# Patient Record
Sex: Female | Born: 1975 | Race: Black or African American | Hispanic: No | Marital: Married | Smoking: Never smoker
Health system: Southern US, Community
[De-identification: ages and names within clinical notes are randomized; demographics above are authoritative.]

## PROBLEM LIST (undated history)

## (undated) DIAGNOSIS — Z789 Other specified health status: Secondary | ICD-10-CM

## (undated) DIAGNOSIS — I10 Essential (primary) hypertension: Secondary | ICD-10-CM

## (undated) HISTORY — PX: WISDOM TOOTH EXTRACTION: SHX21

## (undated) HISTORY — DX: Other specified health status: Z78.9

## (undated) HISTORY — DX: Essential (primary) hypertension: I10

---

## 2017-06-17 DIAGNOSIS — Z3400 Encounter for supervision of normal first pregnancy, unspecified trimester: Secondary | ICD-10-CM | POA: Diagnosis not present

## 2017-06-17 DIAGNOSIS — Z3401 Encounter for supervision of normal first pregnancy, first trimester: Secondary | ICD-10-CM | POA: Diagnosis not present

## 2017-06-17 LAB — OB RESULTS CONSOLE ANTIBODY SCREEN: Antibody Screen: NEGATIVE

## 2017-06-17 LAB — OB RESULTS CONSOLE RUBELLA ANTIBODY, IGM: Rubella: IMMUNE

## 2017-06-17 LAB — OB RESULTS CONSOLE RPR: RPR: NONREACTIVE

## 2017-06-17 LAB — OB RESULTS CONSOLE ABO/RH: RH TYPE: POSITIVE

## 2017-06-17 LAB — OB RESULTS CONSOLE GC/CHLAMYDIA
Chlamydia: NEGATIVE
Gonorrhea: NEGATIVE

## 2017-06-17 LAB — OB RESULTS CONSOLE HIV ANTIBODY (ROUTINE TESTING): HIV: NONREACTIVE

## 2017-06-17 LAB — OB RESULTS CONSOLE HEPATITIS B SURFACE ANTIGEN: Hepatitis B Surface Ag: NEGATIVE

## 2017-07-03 ENCOUNTER — Other Ambulatory Visit: Payer: Self-pay | Admitting: Obstetrics and Gynecology

## 2017-07-03 ENCOUNTER — Other Ambulatory Visit (HOSPITAL_COMMUNITY)
Admission: RE | Admit: 2017-07-03 | Discharge: 2017-07-03 | Disposition: A | Discharge status: Home / Self Care | Payer: Federal, State, Local not specified - PPO | Source: Ambulatory Visit | Attending: Obstetrics and Gynecology | Admitting: Obstetrics and Gynecology

## 2017-07-03 DIAGNOSIS — O09511 Supervision of elderly primigravida, first trimester: Secondary | ICD-10-CM | POA: Diagnosis not present

## 2017-07-03 DIAGNOSIS — Z124 Encounter for screening for malignant neoplasm of cervix: Secondary | ICD-10-CM | POA: Diagnosis not present

## 2017-07-04 LAB — CYTOLOGY - PAP
Diagnosis: NEGATIVE
HPV (WINDOPATH): NOT DETECTED

## 2017-07-15 DIAGNOSIS — O09512 Supervision of elderly primigravida, second trimester: Secondary | ICD-10-CM | POA: Diagnosis not present

## 2017-07-15 DIAGNOSIS — O161 Unspecified maternal hypertension, first trimester: Secondary | ICD-10-CM | POA: Diagnosis not present

## 2017-08-19 DIAGNOSIS — Z3402 Encounter for supervision of normal first pregnancy, second trimester: Secondary | ICD-10-CM | POA: Diagnosis not present

## 2017-08-19 DIAGNOSIS — Z36 Encounter for antenatal screening for chromosomal anomalies: Secondary | ICD-10-CM | POA: Diagnosis not present

## 2017-08-19 DIAGNOSIS — O09512 Supervision of elderly primigravida, second trimester: Secondary | ICD-10-CM | POA: Diagnosis not present

## 2017-09-09 DIAGNOSIS — Z23 Encounter for immunization: Secondary | ICD-10-CM | POA: Diagnosis not present

## 2017-10-28 DIAGNOSIS — O09512 Supervision of elderly primigravida, second trimester: Secondary | ICD-10-CM | POA: Diagnosis not present

## 2017-10-28 DIAGNOSIS — O10012 Pre-existing essential hypertension complicating pregnancy, second trimester: Secondary | ICD-10-CM | POA: Diagnosis not present

## 2017-10-30 DIAGNOSIS — R7302 Impaired glucose tolerance (oral): Secondary | ICD-10-CM | POA: Diagnosis not present

## 2017-11-14 DIAGNOSIS — Z23 Encounter for immunization: Secondary | ICD-10-CM | POA: Diagnosis not present

## 2017-11-25 DIAGNOSIS — O09513 Supervision of elderly primigravida, third trimester: Secondary | ICD-10-CM | POA: Diagnosis not present

## 2017-11-26 NOTE — L&D Delivery Note (Signed)
Operative Delivery Note At 8:19 PM a viable female was delivered via Vaginal, Vacuum Investment banker, operational(Extractor).  Presentation: vertex; Position: Occiput,, Anterior; Low outlet  Pt had variable decelerations.  Pt attempted to push on hands and knees and prolonged variable decel with slow return near baseline was noted.  Recommended vacuum extraction.Verbal consent: obtained from patient.  Risks and benefits discussed in detail.  Risks include, but are not limited to the risks of anesthesia, bleeding, infection, damage to maternal tissues, fetal cephalhematoma.  There is also the risk of inability to effect vaginal delivery of the head, or shoulder dystocia that cannot be resolved by established maneuvers, leading to the need for emergency cesarean section.  With 3 pulls, head was brought to crowning and mother delivered.  APGAR: 8, 9; weight pending .   Placenta status: manually extracted-cord avulsion due to velamentous insertion. Nitroglycerin, Neosynephrine administered.  Intact.  Bedside ultrasound confirmed a thin EMS. Unasyn  IV given.Placenta delivered ~45 minutes after delivery of baby.  Pitocin was held during extraction.  It was resumed after extraction.  Bleeding was not excessive.  After the repair, bladder appeared full b/c pt was leaking.  Consent I/O cath obtained.  Prepped with Betadine.  Cord:  with the following complications Velamentous  insertion, Nuchal cord x 1: .  Cord pH: n/a  Anesthesia:  Stadol Instruments: Kiwi Episiotomy: None Lacerations: 3rd degree;Perineal Suture Repair: 2.0 3.0 chromic vicryl Est. Blood Loss (mL):  400  Mom to postpartum.  Baby to Couplet care / Skin to Skin.  Latasha Fisher 01/14/2018, 9:48 PM

## 2017-11-28 DIAGNOSIS — Z3A32 32 weeks gestation of pregnancy: Secondary | ICD-10-CM | POA: Diagnosis not present

## 2017-11-28 DIAGNOSIS — O09513 Supervision of elderly primigravida, third trimester: Secondary | ICD-10-CM | POA: Diagnosis not present

## 2017-11-28 DIAGNOSIS — O09512 Supervision of elderly primigravida, second trimester: Secondary | ICD-10-CM | POA: Diagnosis not present

## 2017-11-28 DIAGNOSIS — O10013 Pre-existing essential hypertension complicating pregnancy, third trimester: Secondary | ICD-10-CM | POA: Diagnosis not present

## 2017-12-02 DIAGNOSIS — O09512 Supervision of elderly primigravida, second trimester: Secondary | ICD-10-CM | POA: Diagnosis not present

## 2017-12-02 DIAGNOSIS — O09513 Supervision of elderly primigravida, third trimester: Secondary | ICD-10-CM | POA: Diagnosis not present

## 2017-12-05 DIAGNOSIS — O09513 Supervision of elderly primigravida, third trimester: Secondary | ICD-10-CM | POA: Diagnosis not present

## 2017-12-09 DIAGNOSIS — O09513 Supervision of elderly primigravida, third trimester: Secondary | ICD-10-CM | POA: Diagnosis not present

## 2017-12-12 DIAGNOSIS — O09513 Supervision of elderly primigravida, third trimester: Secondary | ICD-10-CM | POA: Diagnosis not present

## 2017-12-12 DIAGNOSIS — O10013 Pre-existing essential hypertension complicating pregnancy, third trimester: Secondary | ICD-10-CM | POA: Diagnosis not present

## 2017-12-16 DIAGNOSIS — O09513 Supervision of elderly primigravida, third trimester: Secondary | ICD-10-CM | POA: Diagnosis not present

## 2017-12-19 DIAGNOSIS — O09513 Supervision of elderly primigravida, third trimester: Secondary | ICD-10-CM | POA: Diagnosis not present

## 2017-12-20 ENCOUNTER — Inpatient Hospital Stay (HOSPITAL_COMMUNITY): Admission: AD | Admit: 2017-12-20 | Payer: Self-pay | Source: Ambulatory Visit | Admitting: Obstetrics & Gynecology

## 2017-12-23 DIAGNOSIS — O09513 Supervision of elderly primigravida, third trimester: Secondary | ICD-10-CM | POA: Diagnosis not present

## 2017-12-23 DIAGNOSIS — O10013 Pre-existing essential hypertension complicating pregnancy, third trimester: Secondary | ICD-10-CM | POA: Diagnosis not present

## 2017-12-26 DIAGNOSIS — O10013 Pre-existing essential hypertension complicating pregnancy, third trimester: Secondary | ICD-10-CM | POA: Diagnosis not present

## 2017-12-30 DIAGNOSIS — O09513 Supervision of elderly primigravida, third trimester: Secondary | ICD-10-CM | POA: Diagnosis not present

## 2018-01-02 DIAGNOSIS — O09513 Supervision of elderly primigravida, third trimester: Secondary | ICD-10-CM | POA: Diagnosis not present

## 2018-01-02 DIAGNOSIS — O10013 Pre-existing essential hypertension complicating pregnancy, third trimester: Secondary | ICD-10-CM | POA: Diagnosis not present

## 2018-01-06 DIAGNOSIS — O09513 Supervision of elderly primigravida, third trimester: Secondary | ICD-10-CM | POA: Diagnosis not present

## 2018-01-09 DIAGNOSIS — O10013 Pre-existing essential hypertension complicating pregnancy, third trimester: Secondary | ICD-10-CM | POA: Diagnosis not present

## 2018-01-09 DIAGNOSIS — O162 Unspecified maternal hypertension, second trimester: Secondary | ICD-10-CM | POA: Diagnosis not present

## 2018-01-13 ENCOUNTER — Encounter (HOSPITAL_COMMUNITY): Payer: Self-pay | Admitting: *Deleted

## 2018-01-13 ENCOUNTER — Telehealth (HOSPITAL_COMMUNITY): Payer: Self-pay | Admitting: *Deleted

## 2018-01-13 ENCOUNTER — Encounter (HOSPITAL_COMMUNITY): Payer: Self-pay

## 2018-01-13 DIAGNOSIS — O10013 Pre-existing essential hypertension complicating pregnancy, third trimester: Secondary | ICD-10-CM | POA: Diagnosis not present

## 2018-01-13 DIAGNOSIS — O09513 Supervision of elderly primigravida, third trimester: Secondary | ICD-10-CM | POA: Diagnosis not present

## 2018-01-13 MED ORDER — LABETALOL HCL 5 MG/ML IV SOLN
20.0000 mg | INTRAVENOUS | Status: DC | PRN
  Administered 2018-01-14: 20 mg via INTRAVENOUS
  Filled 2018-01-13: qty 4

## 2018-01-13 MED ORDER — HYDRALAZINE HCL 20 MG/ML IJ SOLN
5.0000 mg | INTRAMUSCULAR | Status: AC | PRN
  Administered 2018-01-14 (×2): 5 mg via INTRAVENOUS
  Filled 2018-01-13 (×2): qty 1

## 2018-01-13 NOTE — Telephone Encounter (Signed)
Preadmission screen  

## 2018-01-13 NOTE — H&P (Signed)
Latasha Fisher is a 42 y.o. female G1 @ 3038 5/7 weeks for IOL on 01/14/17 due to Chronic HTN, Extreme AMA. Pt conceived spontaneously.  BPs in first trimester were 140/90s.  BPs at home were normal to mild.  As the pregnancy progressed, BP normalized.  Ultrasounds for growth were normal, including normal AFI and placental grade. PNC with [facility] (Dr. Dion BodyVarnado) was o/w uncomplicated.  Panorama was negative.  OB History    Gravida Para Term Preterm AB Living   1             SAB TAB Ectopic Multiple Live Births                 Past Medical History:  Diagnosis Date  . Medical history non-contributory     Family History: family history includes Cancer in her maternal aunt and maternal grandmother; Diabetes in her father and mother; Hypertension in her mother. Social History:  reports that  has never smoked. she has never used smokeless tobacco. Her alcohol and drug histories are not on file.     Maternal Diabetes: No Genetic Screening: Normal Maternal Ultrasounds/Referrals: Normal Fetal Ultrasounds or other Referrals:  None Maternal Substance Abuse:  No Significant Maternal Medications:  None Significant Maternal Lab Results:  Lab values include: Group B Strep positive Other Comments:  Chronic HTN, no medications needed  Review of Systems  Gastrointestinal: Negative for abdominal pain.   Maternal Medical History:  Contractions: Frequency: rare.   Perceived severity is mild.    Fetal activity: Perceived fetal activity is normal.    Prenatal complications: PIH.   Prenatal Complications - Diabetes: none.      Last menstrual period 04/08/2017. Maternal Exam:  Abdomen: Patient reports no abdominal tenderness. Estimated fetal weight is Ultrasound 01/13/17-7 lbs 1 oz, +/- 7 oz.   Fetal presentation: vertex  Introitus: Normal vulva. Pelvis: adequate for delivery.   Cervix: Cervix evaluated by digital exam.   Closed, midposition, soft  Fetal Exam Fetal Monitor Review: Mode:  fetoscope.   Variability: moderate (6-25 bpm).   Pattern: accelerations present and no decelerations.    Fetal State Assessment: Category I - tracings are normal.     Physical Exam  Constitutional: She is oriented to person, place, and time. She appears well-developed and well-nourished. No distress.  HENT:  Head: Normocephalic and atraumatic.  Eyes: EOM are normal.  Neck: Normal range of motion.  Respiratory: Effort normal. No respiratory distress.  Musculoskeletal: Normal range of motion. She exhibits no edema or tenderness.  Neurological: She is alert and oriented to person, place, and time.  Skin: Skin is warm and dry.  Psychiatric: She has a normal mood and affect.    Prenatal labs: ABO, Rh: O/Positive/-- (07/23 0000) Antibody: Negative (07/23 0000) Rubella: Immune (07/23 0000) RPR: Nonreactive (07/23 0000)  HBsAg: Negative (07/23 0000)  HIV: Non-reactive (07/23 0000)  GBS:   Positive  Assessment/Plan: IUP @ 38 5/7 weeks IOL tomorrow. CHTN-Controlled without medications. Extreme AMA-Negative NIPS.  Anemia of pregnancy. GBS positive.  Cytotec for IOL.   PCN with SROM or labor. Baseline Preeclampsia labs will be drawn on admission  Latasha Fisher 01/13/2018, 6:30 PM

## 2018-01-14 ENCOUNTER — Other Ambulatory Visit: Payer: Self-pay

## 2018-01-14 ENCOUNTER — Inpatient Hospital Stay (HOSPITAL_COMMUNITY)
Admission: RE | Admit: 2018-01-14 | Discharge: 2018-01-16 | DRG: 768 | Disposition: A | Discharge status: Home / Self Care | Payer: Federal, State, Local not specified - PPO | Source: Ambulatory Visit | Attending: Obstetrics and Gynecology | Admitting: Obstetrics and Gynecology

## 2018-01-14 ENCOUNTER — Encounter (HOSPITAL_COMMUNITY): Payer: Self-pay

## 2018-01-14 DIAGNOSIS — O99824 Streptococcus B carrier state complicating childbirth: Secondary | ICD-10-CM | POA: Diagnosis present

## 2018-01-14 DIAGNOSIS — O114 Pre-existing hypertension with pre-eclampsia, complicating childbirth: Principal | ICD-10-CM | POA: Diagnosis present

## 2018-01-14 DIAGNOSIS — Z23 Encounter for immunization: Secondary | ICD-10-CM | POA: Diagnosis not present

## 2018-01-14 DIAGNOSIS — O1002 Pre-existing essential hypertension complicating childbirth: Secondary | ICD-10-CM | POA: Diagnosis not present

## 2018-01-14 DIAGNOSIS — O99214 Obesity complicating childbirth: Secondary | ICD-10-CM | POA: Diagnosis present

## 2018-01-14 DIAGNOSIS — O10919 Unspecified pre-existing hypertension complicating pregnancy, unspecified trimester: Secondary | ICD-10-CM | POA: Diagnosis present

## 2018-01-14 DIAGNOSIS — O43123 Velamentous insertion of umbilical cord, third trimester: Secondary | ICD-10-CM | POA: Diagnosis not present

## 2018-01-14 DIAGNOSIS — E669 Obesity, unspecified: Secondary | ICD-10-CM | POA: Diagnosis present

## 2018-01-14 DIAGNOSIS — O9902 Anemia complicating childbirth: Secondary | ICD-10-CM | POA: Diagnosis not present

## 2018-01-14 DIAGNOSIS — D649 Anemia, unspecified: Secondary | ICD-10-CM | POA: Diagnosis present

## 2018-01-14 DIAGNOSIS — Z3A38 38 weeks gestation of pregnancy: Secondary | ICD-10-CM | POA: Diagnosis not present

## 2018-01-14 LAB — COMPREHENSIVE METABOLIC PANEL
ALBUMIN: 2.9 g/dL — AB (ref 3.5–5.0)
ALT: 15 U/L (ref 14–54)
ALT: 16 U/L (ref 14–54)
AST: 21 U/L (ref 15–41)
AST: 24 U/L (ref 15–41)
Albumin: 3 g/dL — ABNORMAL LOW (ref 3.5–5.0)
Alkaline Phosphatase: 181 U/L — ABNORMAL HIGH (ref 38–126)
Alkaline Phosphatase: 196 U/L — ABNORMAL HIGH (ref 38–126)
Anion gap: 8 (ref 5–15)
Anion gap: 9 (ref 5–15)
BILIRUBIN TOTAL: 1.1 mg/dL (ref 0.3–1.2)
BUN: 8 mg/dL (ref 6–20)
BUN: 8 mg/dL (ref 6–20)
CALCIUM: 8.6 mg/dL — AB (ref 8.9–10.3)
CHLORIDE: 106 mmol/L (ref 101–111)
CO2: 21 mmol/L — ABNORMAL LOW (ref 22–32)
CO2: 19 mmol/L — AB (ref 22–32)
CREATININE: 0.71 mg/dL (ref 0.44–1.00)
CREATININE: 0.79 mg/dL (ref 0.44–1.00)
Calcium: 9.6 mg/dL (ref 8.9–10.3)
Chloride: 106 mmol/L (ref 101–111)
GFR calc Af Amer: >60 mL/min (ref >60)
GFR calc non Af Amer: >60 mL/min (ref >60)
GFR calc non Af Amer: >60 mL/min (ref >60)
Glucose, Bld: 86 mg/dL (ref 65–99)
Glucose, Bld: 90 mg/dL (ref 65–99)
Potassium: 3.9 mmol/L (ref 3.5–5.1)
Potassium: 3.9 mmol/L (ref 3.5–5.1)
Sodium: 135 mmol/L (ref 135–145)
Sodium: 134 mmol/L — ABNORMAL LOW (ref 135–145)
TOTAL PROTEIN: 6.8 g/dL (ref 6.5–8.1)
Total Bilirubin: 0.6 mg/dL (ref 0.3–1.2)
Total Protein: 6.4 g/dL — ABNORMAL LOW (ref 6.5–8.1)

## 2018-01-14 LAB — CBC
HCT: 32.2 % — ABNORMAL LOW (ref 36.0–46.0)
HCT: 33.3 % — ABNORMAL LOW (ref 36.0–46.0)
Hemoglobin: 10.2 g/dL — ABNORMAL LOW (ref 12.0–15.0)
Hemoglobin: 10.7 g/dL — ABNORMAL LOW (ref 12.0–15.0)
MCH: 23.9 pg — AB (ref 26.0–34.0)
MCH: 24.1 pg — AB (ref 26.0–34.0)
MCHC: 31.7 g/dL (ref 30.0–36.0)
MCHC: 32.1 g/dL (ref 30.0–36.0)
MCV: 75.4 fL — AB (ref 78.0–100.0)
MCV: 75 fL — AB (ref 78.0–100.0)
PLATELETS: 297 10*3/uL (ref 150–400)
Platelets: 310 10*3/uL (ref 150–400)
RBC: 4.27 MIL/uL (ref 3.87–5.11)
RBC: 4.44 MIL/uL (ref 3.87–5.11)
RDW: 15.1 % (ref 11.5–15.5)
RDW: 15 % (ref 11.5–15.5)
WBC: 13.7 10*3/uL — AB (ref 4.0–10.5)
WBC: 22.1 10*3/uL — ABNORMAL HIGH (ref 4.0–10.5)

## 2018-01-14 LAB — TYPE AND SCREEN
ABO/RH(D): O POS
Antibody Screen: NEGATIVE

## 2018-01-14 LAB — PROTEIN / CREATININE RATIO, URINE
Creatinine, Urine: 121 mg/dL
PROTEIN CREATININE RATIO: 0.14 mg/mg{creat} (ref 0.00–0.15)
Total Protein, Urine: 17 mg/dL

## 2018-01-14 LAB — ABO/RH: ABO/RH(D): O POS

## 2018-01-14 LAB — OB RESULTS CONSOLE GBS: GBS: POSITIVE

## 2018-01-14 LAB — RPR: RPR Ser Ql: NONREACTIVE

## 2018-01-14 MED ORDER — MAGNESIUM HYDROXIDE 400 MG/5ML PO SUSP: 30.0000 mL | ORAL | Status: DC | PRN

## 2018-01-14 MED ORDER — COCONUT OIL OIL: 1.0000 "application " | TOPICAL_OIL | Status: DC | PRN

## 2018-01-14 MED ORDER — PENICILLIN G POT IN DEXTROSE 60000 UNIT/ML IV SOLN
3.0000 10*6.[IU] | INTRAVENOUS | Status: DC
  Administered 2018-01-14: 3 10*6.[IU] via INTRAVENOUS
  Filled 2018-01-14 (×3): qty 50

## 2018-01-14 MED ORDER — MISOPROSTOL 25 MCG QUARTER TABLET
25.0000 ug | ORAL_TABLET | ORAL | Status: DC | PRN
  Administered 2018-01-14 (×2): 25 ug via VAGINAL
  Filled 2018-01-14 (×2): qty 1

## 2018-01-14 MED ORDER — LACTATED RINGERS IV SOLN
500.0000 mL | INTRAVENOUS | Status: DC | PRN
  Administered 2018-01-14: 500 mL via INTRAVENOUS
  Administered 2018-01-14: 300 mL via INTRAVENOUS
  Administered 2018-01-14: 1000 mL via INTRAVENOUS

## 2018-01-14 MED ORDER — PHENYLEPHRINE 40 MCG/ML (10ML) SYRINGE FOR IV PUSH (FOR BLOOD PRESSURE SUPPORT)
PREFILLED_SYRINGE | INTRAVENOUS | Status: AC
  Administered 2018-01-14: 40 ug
  Filled 2018-01-14: qty 40

## 2018-01-14 MED ORDER — ONDANSETRON HCL 4 MG/2ML IJ SOLN
4.0000 mg | Freq: Four times a day (QID) | INTRAMUSCULAR | Status: DC | PRN
  Administered 2018-01-14: 4 mg via INTRAVENOUS
  Filled 2018-01-14: qty 2

## 2018-01-14 MED ORDER — NITROGLYCERIN IN D5W 200-5 MCG/ML-% IV SOLN
0.0000 ug/min | Freq: Once | INTRAVENOUS | Status: DC
  Filled 2018-01-14: qty 250

## 2018-01-14 MED ORDER — PHENYLEPHRINE 40 MCG/ML (10ML) SYRINGE FOR IV PUSH (FOR BLOOD PRESSURE SUPPORT)
160.0000 ug | PREFILLED_SYRINGE | Freq: Once | INTRAVENOUS | Status: AC
  Administered 2018-01-14: 40 ug via INTRAVENOUS

## 2018-01-14 MED ORDER — LIDOCAINE HCL (PF) 1 % IJ SOLN
30.0000 mL | INTRAMUSCULAR | Status: AC | PRN
  Administered 2018-01-14 (×2): 30 mL via SUBCUTANEOUS
  Filled 2018-01-14 (×2): qty 30

## 2018-01-14 MED ORDER — BENZOCAINE-MENTHOL 20-0.5 % EX AERO
1.0000 | INHALATION_SPRAY | CUTANEOUS | Status: DC | PRN
Start: 2018-01-14 — End: 2018-01-16
  Filled 2018-01-14: qty 56

## 2018-01-14 MED ORDER — ONDANSETRON HCL 4 MG/2ML IJ SOLN: 4.0000 mg | INTRAMUSCULAR | Status: DC | PRN

## 2018-01-14 MED ORDER — AMPICILLIN-SULBACTAM SODIUM 3 (2-1) G IJ SOLR
3.0000 g | Freq: Four times a day (QID) | INTRAMUSCULAR | Status: AC
  Administered 2018-01-15 (×3): 3 g via INTRAVENOUS
  Filled 2018-01-14 (×4): qty 3

## 2018-01-14 MED ORDER — ACETAMINOPHEN 325 MG PO TABS: 650.0000 mg | ORAL_TABLET | ORAL | Status: DC | PRN

## 2018-01-14 MED ORDER — OXYCODONE-ACETAMINOPHEN 5-325 MG PO TABS: 1.0000 | ORAL_TABLET | ORAL | Status: DC | PRN

## 2018-01-14 MED ORDER — ZOLPIDEM TARTRATE 5 MG PO TABS: 5.0000 mg | ORAL_TABLET | Freq: Every evening | ORAL | Status: DC | PRN

## 2018-01-14 MED ORDER — DIPHENHYDRAMINE HCL 25 MG PO CAPS: 25.0000 mg | ORAL_CAPSULE | Freq: Four times a day (QID) | ORAL | Status: DC | PRN

## 2018-01-14 MED ORDER — MISOPROSTOL 200 MCG PO TABS
800.0000 ug | ORAL_TABLET | Freq: Once | ORAL | Status: AC
  Administered 2018-01-14: 800 ug via RECTAL
  Filled 2018-01-14: qty 4

## 2018-01-14 MED ORDER — NITROGLYCERIN FOR UTERINE RELAXATION OPTIME 200MCG/ML
200.0000 ug | INTRAVENOUS | Status: DC | PRN
  Administered 2018-01-14 (×6): 200 ug via INTRAVENOUS
  Filled 2018-01-14: qty 250

## 2018-01-14 MED ORDER — MISOPROSTOL 200 MCG PO TABS: 800.0000 ug | ORAL_TABLET | Freq: Once | ORAL | Status: DC | PRN

## 2018-01-14 MED ORDER — BUTORPHANOL TARTRATE 1 MG/ML IJ SOLN
1.0000 mg | INTRAMUSCULAR | Status: DC | PRN
  Administered 2018-01-14 (×3): 1 mg via INTRAVENOUS
  Filled 2018-01-14 (×3): qty 1

## 2018-01-14 MED ORDER — LACTATED RINGERS IV SOLN
INTRAVENOUS | Status: DC
  Administered 2018-01-14 (×2): via INTRAVENOUS

## 2018-01-14 MED ORDER — PRENATAL MULTIVITAMIN CH
1.0000 | ORAL_TABLET | Freq: Every day | ORAL | Status: DC
  Administered 2018-01-15 – 2018-01-16 (×2): 1 via ORAL
  Filled 2018-01-14 (×2): qty 1

## 2018-01-14 MED ORDER — TETANUS-DIPHTH-ACELL PERTUSSIS 5-2.5-18.5 LF-MCG/0.5 IM SUSP: 0.5000 mL | Freq: Once | INTRAMUSCULAR | Status: DC

## 2018-01-14 MED ORDER — TERBUTALINE SULFATE 1 MG/ML IJ SOLN: 0.2500 mg | Freq: Once | INTRAMUSCULAR | Status: DC | PRN

## 2018-01-14 MED ORDER — SIMETHICONE 80 MG PO CHEW
80.0000 mg | CHEWABLE_TABLET | ORAL | Status: DC | PRN
Start: 2018-01-14 — End: 2018-01-16

## 2018-01-14 MED ORDER — OXYCODONE-ACETAMINOPHEN 5-325 MG PO TABS: 2.0000 | ORAL_TABLET | ORAL | Status: DC | PRN

## 2018-01-14 MED ORDER — OXYTOCIN 40 UNITS IN LACTATED RINGERS INFUSION - SIMPLE MED
2.5000 [IU]/h | INTRAVENOUS | Status: DC
  Administered 2018-01-14: 2.5 [IU]/h via INTRAVENOUS

## 2018-01-14 MED ORDER — OXYTOCIN BOLUS FROM INFUSION
500.0000 mL | Freq: Once | INTRAVENOUS | Status: AC
  Administered 2018-01-14: 500 mL via INTRAVENOUS

## 2018-01-14 MED ORDER — DIBUCAINE 1 % RE OINT: 1.0000 "application " | TOPICAL_OINTMENT | RECTAL | Status: DC | PRN

## 2018-01-14 MED ORDER — PHENYLEPHRINE 40 MCG/ML (10ML) SYRINGE FOR IV PUSH (FOR BLOOD PRESSURE SUPPORT)
40.0000 ug | PREFILLED_SYRINGE | INTRAVENOUS | Status: DC | PRN
  Administered 2018-01-14 (×4): 40 ug via INTRAVENOUS

## 2018-01-14 MED ORDER — SENNOSIDES-DOCUSATE SODIUM 8.6-50 MG PO TABS
2.0000 | ORAL_TABLET | ORAL | Status: DC
  Administered 2018-01-15 – 2018-01-16 (×2): 2 via ORAL
  Filled 2018-01-14 (×2): qty 2

## 2018-01-14 MED ORDER — SOD CITRATE-CITRIC ACID 500-334 MG/5ML PO SOLN: 30.0000 mL | ORAL | Status: DC | PRN

## 2018-01-14 MED ORDER — SODIUM CHLORIDE 0.9 % IV SOLN
5.0000 10*6.[IU] | Freq: Once | INTRAVENOUS | Status: AC
  Administered 2018-01-14: 5 10*6.[IU] via INTRAVENOUS
  Filled 2018-01-14: qty 5

## 2018-01-14 MED ORDER — ONDANSETRON HCL 4 MG PO TABS: 4.0000 mg | ORAL_TABLET | ORAL | Status: DC | PRN

## 2018-01-14 MED ORDER — PENICILLIN G POTASSIUM 5000000 UNITS IJ SOLR: 2.5000 10*6.[IU] | INTRAVENOUS | Status: DC

## 2018-01-14 MED ORDER — HYDROXYZINE HCL 50 MG PO TABS: 50.0000 mg | ORAL_TABLET | Freq: Four times a day (QID) | ORAL | Status: DC | PRN

## 2018-01-14 MED ORDER — IBUPROFEN 600 MG PO TABS
600.0000 mg | ORAL_TABLET | Freq: Four times a day (QID) | ORAL | Status: DC
  Administered 2018-01-14 – 2018-01-16 (×7): 600 mg via ORAL
  Filled 2018-01-14 (×8): qty 1

## 2018-01-14 MED ORDER — WITCH HAZEL-GLYCERIN EX PADS: 1.0000 "application " | MEDICATED_PAD | CUTANEOUS | Status: DC | PRN

## 2018-01-14 MED ORDER — OXYTOCIN 40 UNITS IN LACTATED RINGERS INFUSION - SIMPLE MED
1.0000 m[IU]/min | INTRAVENOUS | Status: DC
  Administered 2018-01-14: 2 m[IU]/min via INTRAVENOUS
  Filled 2018-01-14: qty 1000

## 2018-01-14 NOTE — Progress Notes (Signed)
Quinta Mitzi HansenMoody is a 42 y.o. G1P0 at 7916w6d  [facility], Extreme AMA Hydralazine x 1 given overnight.  Cytotec held at 5 am due to decreased variability.  Cytotec #2 placed 7:30 am  Subjective: Pt denies pain.  Just feels pressure, not contractions.  Denies Headache, visual changes.  Pt states she is a little overwhelmed by the whole process.  Never been hospitalized.  Objective: BP 139/82   Pulse 69   Temp 97.6 F (36.4 C) (Oral)   Resp 16   Ht 5\' 7"  (1.702 m)   Wt 102.5 kg (226 lb)   LMP 04/08/2017   BMI 35.40 kg/m  No intake/output data recorded. No intake/output data recorded.  FHT:  Times of decreased variability, but now reactive. No decels. UC:   regular, every 2-3 minutes SVE:   Dilation: 1.5 Effacement (%): 20, 30 Exam by:: Lorn Junes. Goodman, RN  Labs: Lab Results  Component Value Date   WBC 13.7 (H) 01/14/2018   HGB 10.2 (L) 01/14/2018   HCT 32.2 (L) 01/14/2018   MCV 75.4 (L) 01/14/2018   PLT 297 01/14/2018   CMP     Component Value Date/Time   NA 135 01/14/2018 0056   K 3.9 01/14/2018 0056   CL 106 01/14/2018 0056   CO2 21 (L) 01/14/2018 0056   GLUCOSE 86 01/14/2018 0056   BUN 8 01/14/2018 0056   CREATININE 0.71 01/14/2018 0056   CALCIUM 9.6 01/14/2018 0056   PROT 6.4 (L) 01/14/2018 0056   ALBUMIN 2.9 (L) 01/14/2018 0056   AST 21 01/14/2018 0056   ALT 15 01/14/2018 0056   ALKPHOS 181 (H) 01/14/2018 0056   BILITOT 0.6 01/14/2018 0056   GFRNONAA >60 01/14/2018 0056   GFRAA >60 01/14/2018 0056     PCR 0.14  Assessment / Plan: IUP @ 38 6/7 weeks CHTN-Controlled without medications antepartum Extreme AMA-Negative NIPS.            Anemia of pregnancy. GBS positive.    Labor: Start Pitocin if 2+ cm, effaced or regular contractions.Hydralazine for anxiety prn. Preeclampsia:  Baseline Preeclampsia labs are normal.  BP normal now.  Monitor BP closely.  If remains elevated will, start Magnesium. Fetal Wellbeing:  Category I Pain Control:  None at this  time I/D:  GBS+ with active labor or SROM Anticipated MOD:  NSVD  Geryl Rankinsvelyn Joci Dress 01/14/2018, 8:33 AM

## 2018-01-14 NOTE — Progress Notes (Addendum)
In to assess pt. Per RN, pt is 8-9 cm, 0 station.  Just got Stadol IV and Hydralazine. Pt denies headache, visual changes or RUQ pain. 130s/80s Gen:  NAD, comfortable Abd:  No RUQ pain. Neuro:  2+ DTR Cervix deferred. Cat II.  Variable decelerations. Overall reassuring.    Anticipate vaginal delivery. Recheck Preeclampsia labs. Continue to hold Pitocin.  Addendum @1941  Cervix anterior lip. Pt involuntary pushing. Recheck 20 minutes.

## 2018-01-14 NOTE — Anesthesia Pain Management Evaluation Note (Signed)
  CRNA Pain Management Visit Note  Patient: Latasha Fisher, 42 y.o., female  "[facility] We have an anesthesia team available at all times to provide care throughout the hospital, including epidural management and anesthesia for C-section. I don't know your plan for the delivery whether it a natural birth, water birth, IV sedation, nitrous supplementation, doula or epidural, but we want to meet your pain goals."   1.Was your pain managed to your expectations on prior hospitalizations?   No prior hospitalizations  2.What is your expectation for pain management during this hospitalization?     IV pain meds  3.How can we help you reach that goal? Pain medication when desired  Record the patient's initial score and the patient's pain goal.   Pain: 0  Pain Goal: 9 [facility] wants you to be able to say your pain was always managed very well.  Kinslea Frances 01/14/2018

## 2018-01-14 NOTE — Progress Notes (Signed)
Nya Mitzi HansenMoody is a 42 y.o. G1P0 at 4875w6d   Pitocin started due to lack of cervical change.  Started at 2 milliunits.  Subjective: Pt with c/o n/v.  Requesting Stadol for pain.  Has had n/v x2.  Denies headaches.  Objective: BP (!) 152/86   Pulse 82   Temp 98.1 F (36.7 C) (Oral)   Resp 18   Ht 5\' 7"  (1.702 m)   Wt 102.5 kg (226 lb)   LMP 04/08/2017   BMI 35.40 kg/m  No intake/output data recorded. No intake/output data recorded.  FHT:  FHR: 140s bpm, variability: moderate,  accelerations:  Present,  decelerations:  Present prolonged deceleration UC:   Tracing is difficult to interpret SVE:   Dilation: 3.5 Effacement (%): 60 Station: -3 Exam by:: Dr. Barbette ReichmannVarnado Vertex.   IUPC placed on pt's posterior right without difficulty.  After inserting IUPC, prolonged decel to the 50s noted.  Did not resolve with pt turning to her right side.  Turned to left and there was some improvement. Pitocin discontinued.  Pt layed supine for FSE placement and further improvement noted.  FSE placed without difficulty.  IUPC removed.  Baby returned to baseline with variability. Labs: Lab Results  Component Value Date   WBC 13.7 (H) 01/14/2018   HGB 10.2 (L) 01/14/2018   HCT 32.2 (L) 01/14/2018   MCV 75.4 (L) 01/14/2018   PLT 297 01/14/2018    Assessment / Plan: IUP @ 39 6/7 weeks  Labor: Prolonged deceleration.  Suspect hyperstimulation.  Continue to observe.  Recheck in 2 hours and start Pitocin. Preeclampsia:  BP normal to mildly elevated Fetal Wellbeing:  Prolonged deceleration.  Cat II with mild variables Pain Control:  IV pain meds I/D:  SROM.  PCN x 1 Anticipated MOD:  NSVD  Geryl Rankinsvelyn Delbert Vu 01/14/2018, 2:37 PM

## 2018-01-15 ENCOUNTER — Encounter (HOSPITAL_COMMUNITY): Payer: Self-pay

## 2018-01-15 LAB — CBC
HEMATOCRIT: 27.4 % — AB (ref 36.0–46.0)
Hemoglobin: 9 g/dL — ABNORMAL LOW (ref 12.0–15.0)
MCH: 24.7 pg — ABNORMAL LOW (ref 26.0–34.0)
MCHC: 32.8 g/dL (ref 30.0–36.0)
MCV: 75.3 fL — ABNORMAL LOW (ref 78.0–100.0)
Platelets: 264 10*3/uL (ref 150–400)
RBC: 3.64 MIL/uL — ABNORMAL LOW (ref 3.87–5.11)
RDW: 15 % (ref 11.5–15.5)
WBC: 21.7 10*3/uL — AB (ref 4.0–10.5)

## 2018-01-15 NOTE — Lactation Note (Signed)
This note was copied from a baby's chart. Lactation Consultation Note  Patient Name: Latasha Fisher ZOXWR'UToday's Date: 01/15/2018 Reason for consult: Initial assessment;Difficult latch;Early term 37-38.6wks   Initial assessment with first time mom of 3714 hour old infant. Infant with 1 BF, 1 BF attempt, and formula of 5 cc via syringe x 1. Parents report infant has been gaggy and sleepy.   Mom with large compressible breasts with edematous areola. Mom with flat nipples today, she reports they are usually everted. Gave mom shells with instructions for use. [facility] shells [facility]. Enc manual pump to evert nipple as needed before feeding.   Infant was awake and fussing a little. Attempted to latch infant to the right breast in the football hold. Infant would attempt to latch and mom noted pain. Areola was difficult to compress completely and infant just getting on the nipple. Nipple did evert some with stimulation. # 24 NS was placed, infant latched on and then fell asleep. Enc mom to keep infant STS as much as possible.   Attempted to hand express both breasts and no colostrum was noted. Mom is to pump about every 3 hours with DEBP on Initiate setting after BF attempt and follow with hand expression. Reviewed milk coming to volume. Mom is planning to use formula as needed if EBM not available and infant will not latch. Breast milk storage at room temperature reviewed. Reviewed pump cleaning after each use.   BF Resources handout and LC Brochure given, mom informed of IP/OP Services, BF Support Groups and LC phone #. Mom has a pump at home, she is unsure of brand.   Mom to call out for feeding assistance as needed.    Maternal Data Formula Feeding for Exclusion: No Has patient been taught Hand Expression?: Yes Does the patient have breastfeeding experience prior to this delivery?: No  Feeding Feeding Type: Breast Fed Length of feed: 0 min  LATCH Score Latch: Repeated attempts  needed to sustain latch, nipple held in mouth throughout feeding, stimulation needed to elicit sucking reflex.  Audible Swallowing: None  Type of Nipple: Flat  Comfort (Breast/Nipple): Filling, red/small blisters or bruises, mild/mod discomfort  Hold (Positioning): Assistance needed to correctly position infant at breast and maintain latch.  LATCH Score: 4  Interventions Interventions: Breast feeding basics reviewed;Support pillows;Assisted with latch;Position options;Skin to skin;Expressed milk;Hand express;Breast compression;Reverse pressure;Pre-pump if needed;Hand pump;Breast massage;Adjust position;DEBP  Lactation Tools Discussed/Used Tools: Nipple Shields;Pump;Shells Nipple shield size: 24 Breast pump type: Double-Electric Breast Pump;Manual WIC Program: No Pump Review: Setup, frequency, and cleaning;Milk Storage Initiated by:: Reviewed and encouraged about every 3 hours   Consult Status Consult Status: Follow-up Date: 01/16/18 Follow-up type: In-patient    Latasha Fisher 01/15/2018, 11:50 AM

## 2018-01-16 MED ORDER — IBUPROFEN 600 MG PO TABS: 600.0000 mg | ORAL_TABLET | Freq: Four times a day (QID) | ORAL | 0 refills | Status: DC

## 2018-01-16 MED ORDER — DOCUSATE SODIUM 100 MG PO CAPS: 100.0000 mg | ORAL_CAPSULE | Freq: Two times a day (BID) | ORAL | 0 refills | Status: DC

## 2018-01-16 NOTE — Progress Notes (Signed)
Postpartum day #1, VAVD  Subjective Pt without complaints.  Lochia normal.  Pain controlled with scheduled Motrin.  Breast feeding yes   Gen:  NAD, A&O x 3 Uterine fundus:  Firm, nontender Lochia normal Ext: SCDs on , no calf tenderness bilaterally  CBC    Component Value Date/Time   WBC 21.7 (H) 01/15/2018 0555   RBC 3.64 (L) 01/15/2018 0555   HGB 9.0 (L) 01/15/2018 0555   HCT 27.4 (L) 01/15/2018 0555   PLT 264 01/15/2018 0555   MCV 75.3 (L) 01/15/2018 0555   MCH 24.7 (L) 01/15/2018 0555   MCHC 32.8 01/15/2018 0555   RDW 15.0 01/15/2018 0555     A/P: S/p VAVD, 3rd degree laceration, manual extraction of placenta- doing well. CHTN-  BP elevated immediate postpartum but has been normal in the last 12 hours.  Continue to monitor BP q 4 hours. Unasyn x 24 hours Routine postpartum care.  Sitz baths daily.  Stool softeners. Lactation support. Discharge tomorrow if BP remains normal.  Latasha Fisher 01/16/2018, 7:51 AM

## 2018-01-16 NOTE — Discharge Instructions (Addendum)
Vaginal Delivery, Care After °Refer to this sheet in the next few weeks. These instructions provide you with information about caring for yourself after vaginal delivery. Your health care provider may also give you more specific instructions. Your treatment has been planned according to current medical practices, but problems sometimes occur. Call your health care provider if you have any problems or questions. °What can I expect after the procedure? °After vaginal delivery, it is common to have: °· Some bleeding from your vagina. °· Soreness in your abdomen, your vagina, and the area of skin between your vaginal opening and your anus (perineum). °· Pelvic cramps. °· Fatigue. ° °Follow these instructions at home: °Medicines °· Take over-the-counter and prescription medicines only as told by your health care provider. °· If you were prescribed an antibiotic medicine, take it as told by your health care provider. Do not stop taking the antibiotic until it is finished. °Driving ° °· Do not drive or operate heavy machinery while taking prescription pain medicine. °· Do not drive for 24 hours if you received a sedative. °Lifestyle °· Do not drink alcohol. This is especially important if you are breastfeeding or taking medicine to relieve pain. °· Do not use tobacco products, including cigarettes, chewing tobacco, or e-cigarettes. If you need help quitting, ask your health care provider. °Eating and drinking °· Drink at least 8 eight-ounce glasses of water every day unless you are told not to by your health care provider. If you choose to breastfeed your baby, you may need to drink more water than this. °· Eat high-fiber foods every day. These foods may help prevent or relieve constipation. High-fiber foods include: °? Whole grain cereals and breads. °? Brown rice. °? Beans. °? Fresh fruits and vegetables. °Activity °· Return to your normal activities as told by your health care provider. Ask your health care provider  what activities are safe for you. °· Rest as much as possible. Try to rest or take a nap when your baby is sleeping. °· Do not lift anything that is heavier than your baby or 10 lb (4.5 kg) until your health care provider says that it is safe. °· Talk with your health care provider about when you can engage in sexual activity. This may depend on your: °? Risk of infection. °? Rate of healing. °? Comfort and desire to engage in sexual activity. °Vaginal Care °· If you have an episiotomy or a vaginal tear, check the area every day for signs of infection. Check for: °? More redness, swelling, or pain. °? More fluid or blood. °? Warmth. °? Pus or a bad smell. °· Do not use tampons or douches until your health care provider says this is safe. °· Watch for any blood clots that may pass from your vagina. These may look like clumps of dark red, brown, or black discharge. °General instructions °· Keep your perineum clean and dry as told by your health care provider. °· Wear loose, comfortable clothing. °· Wipe from front to back when you use the toilet. °· Ask your health care provider if you can shower or take a bath. If you had an episiotomy or a perineal tear during labor and delivery, your health care provider may tell you not to take baths for a certain length of time. °· Wear a bra that supports your breasts and fits you well. °· If possible, have someone help you with household activities and help care for your baby for at least a few days after   you leave the hospital.  Keep all follow-up visits for you and your baby as told by your health care provider. This is important. Contact a health care provider if:  You have: ? Vaginal discharge that has a bad smell. ? Difficulty urinating. ? Pain when urinating. ? A sudden increase or decrease in the frequency of your bowel movements. ? More redness, swelling, or pain around your episiotomy or vaginal tear. ? More fluid or blood coming from your episiotomy or  vaginal tear. ? Pus or a bad smell coming from your episiotomy or vaginal tear. ? A fever. ? A rash. ? Little or no interest in activities you used to enjoy. ? Questions about caring for yourself or your baby.  Your episiotomy or vaginal tear feels warm to the touch.  Your episiotomy or vaginal tear is separating or does not appear to be healing.  Your breasts are painful, hard, or turn red.  You feel unusually sad or worried.  You feel nauseous or you vomit.  You pass large blood clots from your vagina. If you pass a blood clot from your vagina, save it to show to your health care provider. Do not flush blood clots down the toilet without having your health care provider look at them.  You urinate more than usual.  You are dizzy or light-headed.  You have not breastfed at all and you have not had a menstrual period for 12 weeks after delivery.  You have stopped breastfeeding and you have not had a menstrual period for 12 weeks after you stopped breastfeeding. Get help right away if:  You have: ? Pain that does not go away or does not get better with medicine. ? Chest pain. ? Difficulty breathing. ? Blurred vision or spots in your vision. ? Thoughts about hurting yourself or your baby.  You develop pain in your abdomen or in one of your legs.  You develop a severe headache.  You faint.  You bleed from your vagina so much that you fill two sanitary pads in one hour. This information is not intended to replace advice given to you by your health care provider. Make sure you discuss any questions you have with your health care provider.    Preeclampsia and Eclampsia Preeclampsia is a serious condition that develops only during pregnancy. It is also called toxemia of pregnancy. This condition causes high blood pressure along with other symptoms, such as swelling and headaches. These symptoms may develop as the condition gets worse. Preeclampsia may occur at 20 weeks of  pregnancy or later. Diagnosing and treating preeclampsia early is very important. If not treated early, it can cause serious problems for you and your baby. One problem it can lead to is eclampsia, which is a condition that causes muscle jerking or shaking (convulsions or seizures) in the mother. Delivering your baby is the best treatment for preeclampsia or eclampsia. Preeclampsia and eclampsia symptoms usually go away after your baby is born. What are the causes? The cause of preeclampsia is not known. What increases the risk? The following risk factors make you more likely to develop preeclampsia:  Being pregnant for the first time.  Having had preeclampsia during a past pregnancy.  Having a family history of preeclampsia.  Having high blood pressure.  Being pregnant with twins or triplets.  Being 44 or older.  Being African-American.  Having kidney disease or diabetes.  Having medical conditions such as lupus or blood diseases.  Being very overweight (obese).  What  are the signs or symptoms? The earliest signs of preeclampsia are:  High blood pressure.  Increased protein in your urine. Your health care provider will check for this at every visit before you give birth (prenatal visit).  Other symptoms that may develop as the condition gets worse include:  Severe headaches.  Sudden weight gain.  Swelling of the hands, face, legs, and feet.  Nausea and vomiting.  Vision problems, such as blurred or double vision.  Numbness in the face, arms, legs, and feet.  Urinating less than usual.  Dizziness.  Slurred speech.  Abdominal pain, especially upper abdominal pain.  Convulsions or seizures.  Symptoms generally go away after giving birth. How is this diagnosed? There are no screening tests for preeclampsia. Your health care provider will ask you about symptoms and check for signs of preeclampsia during your prenatal visits. You may also have tests that  include:  Urine tests.  Blood tests.  Checking your blood pressure.  Monitoring your babys heart rate.  Ultrasound.  How is this treated? You and your health care provider will determine the treatment approach that is best for you. Treatment may include:  Having more frequent prenatal exams to check for signs of preeclampsia, if you have an increased risk for preeclampsia.  Bed rest.  Reducing how much salt (sodium) you eat.  Medicine to lower your blood pressure.  Staying in the hospital, if your condition is severe. There, treatment will focus on controlling your blood pressure and the amount of fluids in your body (fluid retention).  You may need to take medicine (magnesium sulfate) to prevent seizures. This medicine may be given as an injection or through an IV tube.  Delivering your baby early, if your condition gets worse. You may have your labor started with medicine (induced), or you may have a cesarean delivery.  Follow these instructions at home: Eating and drinking   Drink enough fluid to keep your urine clear or pale yellow.  Eat a healthy diet that is low in sodium. Do not add salt to your food. Check nutrition labels to see how much sodium a food or beverage contains.  Avoid caffeine. Lifestyle  Do not use any products that contain nicotine or tobacco, such as cigarettes and e-cigarettes. If you need help quitting, ask your health care provider.  Do not use alcohol or drugs.  Avoid stress as much as possible. Rest and get plenty of sleep. General instructions  Take over-the-counter and prescription medicines only as told by your health care provider.  When lying down, lie on your side. This keeps pressure off of your baby.  When sitting or lying down, raise (elevate) your feet. Try putting some pillows underneath your lower legs.  Exercise regularly. Ask your health care provider what kinds of exercise are best for you.  Keep all follow-up and  prenatal visits as told by your health care provider. This is important. How is this prevented? To prevent preeclampsia or eclampsia from developing during another pregnancy:  Get proper medical care during pregnancy. Your health care provider may be able to prevent preeclampsia or diagnose and treat it early.  Your health care provider may have you take a low-dose aspirin or a calcium supplement during your next pregnancy.  You may have tests of your blood pressure and kidney function after giving birth.  Maintain a healthy weight. Ask your health care provider for help managing weight gain during pregnancy.  Work with your health care provider to manage any  long-term (chronic) health conditions you have, such as diabetes or kidney problems.  Contact a health care provider if:  You gain more weight than expected.  You have headaches.  You have nausea or vomiting.  You have abdominal pain.  You feel dizzy or light-headed. Get help right away if:  You develop sudden or severe swelling anywhere in your body. This usually happens in the legs.  You gain 5 lbs (2.3 kg) or more during one week.  You have severe: ? Abdominal pain. ? Headaches. ? Dizziness. ? Vision problems. ? Confusion. ? Nausea or vomiting.  You have a seizure.  You have trouble moving any part of your body.  You develop numbness in any part of your body.  You have trouble speaking.  You have any abnormal bleeding.  You pass out. This information is not intended to replace advice given to you by your health care provider. Make sure you discuss any questions you have with your health care provider. Document Released: 11/09/2000 Document Revised: 07/10/2016 Document Reviewed: 06/18/2016 Elsevier Interactive Patient Education  2018 [employer]  Document Released: 11/09/2000 Document Revised: 04/25/2016 Document Reviewed: 11/27/2015 Elsevier Interactive Patient Education  2018 [employer].

## 2018-01-16 NOTE — Progress Notes (Signed)
Postpartum Note Day # 2  S:  Patient resting comfortable in bed.  Pain controlled.  Tolerating general diet.  + flatus, + BM.  Lochia moderate.  Ambulating without difficulty.  She denies n/v/f/c, SOB, or CP.  Pt plans on breastfeeding.  O: Temp:  [97.8 F (36.6 C)-98.4 F (36.9 C)] 97.8 F (36.6 C) (02/21 0529) Pulse Rate:  [63-83] 63 (02/21 0529) Resp:  [16-18] 18 (02/21 0529) BP: (123-148)/(63-88) 148/88 (02/21 0601) SpO2:  [100 %] 100 % (02/21 0529) Gen: A&Ox3, NAD CV: RRR Resp: CTAB Abdomen: soft, NT, ND Uterus: firm, non-tender, below umbilicus Ext: No edema, no calf tenderness bilaterally  Labs:  Recent Labs    01/14/18 1824 01/15/18 0555  HGB 10.7* 9.0*    A/P: Pt is a 42 y.o. G1P1001 s/p VAVD with 3rd deg laceration, PPD#2  -chronic HTN- currently not on medication, BP wnl though elevated this am.  Plan to monitor closely this am.  Will consider starting procardia, plan for f/u in 1wk - Pain well controlled -GU: voiding freely -GI: Tolerating general diet -Activity: encouraged sitting up to chair and ambulation as tolerated -Prophylaxis: early ambulation -Labs: appropriate for recent delivery  -meeting postpartum milestones appropriately, plan for discharge home later today, if BP remains stable  Myna HidalgoJennifer Mattison Golay, DO [phone] (pager) [phone] (office)

## 2018-01-16 NOTE — Lactation Note (Signed)
This note was copied from a baby'Fisher chart. Lactation Consultation Note  Patient Name: Latasha Fisher ZOXWR'UToday'Fisher Date: 01/16/2018  Mom states baby is latching using a nipple shield.  She reports her breasts are feeling fuller.  Instructed to continue to pump every 2-3 hours and give any expressed milk back to baby.  She is not obtaining milk yet.  Reviewed lactation outpatient services and encouraged to call prn.   Maternal Data    Feeding Feeding Type: Breast Fed Length of feed: 30 min  LATCH Score Latch: Grasps breast easily, tongue down, lips flanged, rhythmical sucking.  Audible Swallowing: A few with stimulation  Type of Nipple: Flat  Comfort (Breast/Nipple): Soft / non-tender  Hold (Positioning): Assistance needed to correctly position infant at breast and maintain latch.  LATCH Score: 7  Interventions    Lactation Tools Discussed/Used     Consult Status      Latasha Fisher, Latasha Fisher 01/16/2018, 10:43 AM

## 2018-01-21 DIAGNOSIS — O169 Unspecified maternal hypertension, unspecified trimester: Secondary | ICD-10-CM | POA: Diagnosis not present

## 2018-01-23 ENCOUNTER — Encounter (HOSPITAL_COMMUNITY): Payer: Self-pay | Admitting: *Deleted

## 2018-01-23 ENCOUNTER — Inpatient Hospital Stay (HOSPITAL_COMMUNITY)
Admission: AD | Admit: 2018-01-23 | Discharge: 2018-01-24 | DRG: 776 | Disposition: A | Discharge status: Home / Self Care | Payer: Federal, State, Local not specified - PPO | Source: Ambulatory Visit | Attending: Obstetrics and Gynecology | Admitting: Obstetrics and Gynecology

## 2018-01-23 ENCOUNTER — Other Ambulatory Visit: Payer: Self-pay

## 2018-01-23 DIAGNOSIS — O115 Pre-existing hypertension with pre-eclampsia, complicating the puerperium: Secondary | ICD-10-CM | POA: Diagnosis not present

## 2018-01-23 DIAGNOSIS — O1495 Unspecified pre-eclampsia, complicating the puerperium: Secondary | ICD-10-CM | POA: Diagnosis present

## 2018-01-23 DIAGNOSIS — Z79899 Other long term (current) drug therapy: Secondary | ICD-10-CM | POA: Diagnosis not present

## 2018-01-23 LAB — COMPREHENSIVE METABOLIC PANEL
ALBUMIN: 3.3 g/dL — AB (ref 3.5–5.0)
ALK PHOS: 109 U/L (ref 38–126)
ALT: 30 U/L (ref 14–54)
ANION GAP: 9 (ref 5–15)
AST: 19 U/L (ref 15–41)
BILIRUBIN TOTAL: 1 mg/dL (ref 0.3–1.2)
BUN: 15 mg/dL (ref 6–20)
CALCIUM: 8.5 mg/dL — AB (ref 8.9–10.3)
CO2: 23 mmol/L (ref 22–32)
CREATININE: 0.99 mg/dL (ref 0.44–1.00)
Chloride: 105 mmol/L (ref 101–111)
GFR calc non Af Amer: >60 mL/min (ref >60)
GLUCOSE: 104 mg/dL — AB (ref 65–99)
Potassium: 3.6 mmol/L (ref 3.5–5.1)
Sodium: 137 mmol/L (ref 135–145)
TOTAL PROTEIN: 7.1 g/dL (ref 6.5–8.1)

## 2018-01-23 LAB — CBC
HCT: 31.5 % — ABNORMAL LOW (ref 36.0–46.0)
HEMOGLOBIN: 10.1 g/dL — AB (ref 12.0–15.0)
MCH: 24 pg — ABNORMAL LOW (ref 26.0–34.0)
MCHC: 32.1 g/dL (ref 30.0–36.0)
MCV: 75 fL — ABNORMAL LOW (ref 78.0–100.0)
PLATELETS: 373 10*3/uL (ref 150–400)
RBC: 4.2 MIL/uL (ref 3.87–5.11)
RDW: 14.9 % (ref 11.5–15.5)
WBC: 14.3 10*3/uL — ABNORMAL HIGH (ref 4.0–10.5)

## 2018-01-23 MED ORDER — MAGNESIUM SULFATE 40 G IN LACTATED RINGERS - SIMPLE
2.0000 g/h | INTRAVENOUS | Status: AC
  Administered 2018-01-23: 2 g/h via INTRAVENOUS
  Filled 2018-01-23: qty 40
  Filled 2018-01-23: qty 500

## 2018-01-23 MED ORDER — LABETALOL HCL 5 MG/ML IV SOLN
20.0000 mg | INTRAVENOUS | Status: DC | PRN
  Filled 2018-01-23: qty 4

## 2018-01-23 MED ORDER — MAGNESIUM SULFATE BOLUS VIA INFUSION
4.0000 g | Freq: Once | INTRAVENOUS | Status: AC
  Administered 2018-01-23: 4 g via INTRAVENOUS
  Filled 2018-01-23: qty 500

## 2018-01-23 MED ORDER — HYDRALAZINE HCL 20 MG/ML IJ SOLN
5.0000 mg | INTRAMUSCULAR | Status: AC | PRN
  Administered 2018-01-23: 5 mg via INTRAVENOUS
  Administered 2018-01-23: 10 mg via INTRAVENOUS
  Filled 2018-01-23 (×2): qty 1

## 2018-01-23 MED ORDER — LACTATED RINGERS IV SOLN
INTRAVENOUS | Status: DC
  Administered 2018-01-23 (×3): via INTRAVENOUS

## 2018-01-23 MED ORDER — AMLODIPINE BESYLATE 5 MG PO TABS: 5.0000 mg | ORAL_TABLET | Freq: Every day | ORAL | Status: DC

## 2018-01-23 MED ORDER — AMLODIPINE BESYLATE 5 MG PO TABS
5.0000 mg | ORAL_TABLET | Freq: Every day | ORAL | Status: DC
  Administered 2018-01-23 – 2018-01-24 (×2): 5 mg via ORAL
  Filled 2018-01-23 (×2): qty 1

## 2018-01-23 NOTE — H&P (Signed)
Latasha Fisher is an 42 y.o. female. G1 P1 S/p SVD PPD #5 admitted for [facility] with superimposed preeclampsia.  Pt diagnosed with Chronic HTN in the first trimester.  BPs more normal to mild during the pregnancy.  Intrapartum BP significantly elevated above baseline and IV Hydralazine was given serveral times.  PPD #1, Bps trended down to normal.  Pt was discharged on PPD #2 without medication with close follow up in office for BP check.  BP was 150s/80s.  Procardia XL 30 mg was started.  Yesterday [facility] Nurse called in with BP 166/98.  Labs were done in office CBC, CMP were normal but PCR 0.99. Pt states she had a headache after taking Procardia XL 30 mg @ 6 am but is not currently having a headache.  Denies visual changes or upper abdominal pain. Milk supply has not come in yet.  She has a lactation appt today.  Pertinent Gynecological History: OB History: G1, P1   Menstrual History: No LMP recorded.    Past Medical History:  Diagnosis Date  . Medical history non-contributory     Past Surgical History:  Procedure Laterality Date  . WISDOM TOOTH EXTRACTION      Family History  Problem Relation Age of Onset  . Hypertension Mother   . Diabetes Mother   . Diabetes Father   . Cancer Maternal Aunt   . Cancer Maternal Grandmother     Social History:  reports that  has never smoked. she has never used smokeless tobacco. She reports that she does not drink alcohol or use drugs.  Allergies: No Known Allergies  Medications Prior to Admission  Medication Sig Dispense Refill Last Dose  . calcium carbonate (TUMS - DOSED IN MG ELEMENTAL CALCIUM) 500 MG chewable tablet Chew 1 tablet by mouth 2 (two) times daily as needed for indigestion or heartburn.   01/13/2018 at Unknown time  . docusate sodium (COLACE) 100 MG capsule Take 1 capsule (100 mg total) by mouth 2 (two) times daily. 30 capsule 0   . ibuprofen (ADVIL,MOTRIN) 600 MG tablet Take 1 tablet (600 mg total) by mouth every 6 (six) hours.  30 tablet 0   . Iron-FA-B Cmp-C-Biot-Probiotic (FUSION PLUS) CAPS Take 1 capsule by mouth daily.   2 01/13/2018 at Unknown time  . Prenatal Vit-Fe Fumarate-FA (PRENATAL MULTIVITAMIN) TABS tablet Take 1 tablet by mouth daily at 12 noon.   01/13/2018 at Unknown time    Review of Systems  Eyes:       No visual changes.  Gastrointestinal: Negative for abdominal pain.  Neurological: Negative for headaches.    Blood pressure (!) 142/82, pulse 79, temperature 98.1 F (36.7 C), temperature source Oral, resp. rate 17, height 5\' 7"  (1.702 m), SpO2 100 %, unknown if currently breastfeeding. Physical Exam  Constitutional: She is oriented to person, place, and time. She appears well-developed and well-nourished.  HENT:  Head: Normocephalic and atraumatic.  Eyes: EOM are normal.  Neck: Normal range of motion.  Cardiovascular: Normal rate and regular rhythm.  Respiratory: Effort normal and breath sounds normal. No respiratory distress. She has no wheezes. She has no rales.  Musculoskeletal: She exhibits edema.  3+ pitting edema to the knee  Neurological: She is alert and oriented to person, place, and time.  Skin: Skin is warm and dry.  Psychiatric: She has a normal mood and affect.    Results for orders placed or performed during the hospital encounter of 01/23/18 (from the past 24 hour(s))  Comprehensive metabolic panel  Status: Abnormal   Collection Time: 01/23/18  2:38 AM  Result Value Ref Range   Sodium 137 135 - 145 mmol/L   Potassium 3.6 3.5 - 5.1 mmol/L   Chloride 105 101 - 111 mmol/L   CO2 23 22 - 32 mmol/L   Glucose, Bld 104 (H) 65 - 99 mg/dL   BUN 15 6 - 20 mg/dL   Creatinine, Ser 0.980.99 0.44 - 1.00 mg/dL   Calcium 8.5 (L) 8.9 - 10.3 mg/dL   Total Protein 7.1 6.5 - 8.1 g/dL   Albumin 3.3 (L) 3.5 - 5.0 g/dL   AST 19 15 - 41 U/L   ALT 30 14 - 54 U/L   Alkaline Phosphatase 109 38 - 126 U/L   Total Bilirubin 1.0 0.3 - 1.2 mg/dL   GFR calc non Af Amer >60 >60 mL/min   GFR calc  Af Amer >60 >60 mL/min   Anion gap 9 5 - 15  CBC     Status: Abnormal   Collection Time: 01/23/18  2:38 AM  Result Value Ref Range   WBC 14.3 (H) 4.0 - 10.5 K/uL   RBC 4.20 3.87 - 5.11 MIL/uL   Hemoglobin 10.1 (L) 12.0 - 15.0 g/dL   HCT 11.931.5 (L) 14.736.0 - 82.946.0 %   MCV 75.0 (L) 78.0 - 100.0 fL   MCH 24.0 (L) 26.0 - 34.0 pg   MCHC 32.1 30.0 - 36.0 g/dL   RDW 56.214.9 13.011.5 - 86.515.5 %   Platelets 373 150 - 400 K/uL    No results found.  Assessment/Plan: CHTN with Superimposed Preeclampsia Magnesium sulfate started at ~ 0300.  Continue for 24 hours. Strict I/Os Labs normal but Cr has trended up. Hydralazine for persistent severe range BPs. Lactation support.  Latasha Fisher 01/23/2018, 5:23 AM

## 2018-01-23 NOTE — Progress Notes (Signed)
Pt without complaints. Denies headaches or visual changes.   Temp:  [97.4 F (36.3 C)-98.6 F (37 C)] 97.4 F (36.3 C) (02/28 1600) Pulse Rate:  [76-93] 91 (02/28 1600) Resp:  [15-20] 16 (02/28 1700) BP: (130-167)/(80-100) 148/86 (02/28 1600) SpO2:  [100 %] 100 % (02/28 1600) Weight:  [92.1 kg (203 lb 0.8 oz)] 92.1 kg (203 lb 0.8 oz) (02/28 0550)   BP (!) 148/86 (BP Location: Right Arm)   Pulse 91   Temp (!) 97.4 F (36.3 C) (Oral)   Resp 16   Ht 5\' 7"  (1.702 m)   Wt 92.1 kg (203 lb 0.8 oz)   SpO2 100%   BMI 31.80 kg/m    Gen:  NAD, comfortable, normal speech Lungs:  Normal breathing Ext:  Swollen, TED hose in place.  A/p  CHTN with superimposed Preeclampsia. On Magnesium sulfate with significant diuresis, 3.5 liters. Discontinue at 2:45 am then start Norvasc 5 mg. Observe BP. Dr. Charlotta Newtonzan covering from now until 7 pm.  Pt aware.

## 2018-01-23 NOTE — Lactation Note (Signed)
Lactation Consultation Note Mom is re-admit for pre-eclampsia , baby born on 2/19.  Mom has been BF. Mom has an OP LC appt. Today at 2:00. Will report so hopefully LC can see inpatient.  This LC set up DEBP and strongly encouraged mom to pump.  Assessed mom's breast. Mom wearing supportive bra, has everted nipples. Hand expressed milk. Breast wasn't full feeling. Couldn't tell much difference between breast that mom has BF on than the one that wasn't. Encouraged pumping for extra stimulation.   Patient Name: Latasha Anismy Sloane ZOXWR'UToday's Date: 01/23/2018     Maternal Data    Feeding    LATCH Score                   Interventions    Lactation Tools Discussed/Used     Consult Status      Charyl DancerCARVER, [name] 01/23/2018, 6:57 AM

## 2018-01-23 NOTE — Progress Notes (Signed)
Reviewed chart remotely. BP mildly elevated. Diuresis ~ 3 L. Continue Magnesium to complete 24 hours.

## 2018-01-24 DIAGNOSIS — O115 Pre-existing hypertension with pre-eclampsia, complicating the puerperium: Secondary | ICD-10-CM | POA: Diagnosis not present

## 2018-01-24 MED ORDER — AMLODIPINE BESYLATE 5 MG PO TABS: 5.0000 mg | ORAL_TABLET | Freq: Every day | ORAL | 1 refills | Status: AC

## 2018-01-24 NOTE — Progress Notes (Signed)
Discharged home with significant other.Escorted to car via wheel chair by Bethann Berkshireourtney Sanchez, NT

## 2018-01-24 NOTE — Progress Notes (Signed)
Patient denies headache visual disturbances or ruq pain.   Vitals:   01/24/18 0800 01/24/18 1200 01/24/18 1531 01/24/18 1554  BP: (!) 145/82 135/76 139/84 138/70  Pulse: 84 100 92 90  Resp: 18 18 18 18   Temp: 98.9 F (37.2 C) 98.7 F (37.1 C) 98.1 F (36.7 C)   TempSrc: Oral Oral Oral   SpO2: 99% 97% 100%   Weight:      Height:       General Alert no acute distress  Lungs no distress  Abdomen soft nontender  Extremeties no edema Normal reflexes  A/P CHTN with superimposed Preeclampsia. S/p  Magnesium sulfate with significant diuresis, 7  liters. Continue Norvasc 5 mg D/C home  Follow up this week for bp check in the office

## 2018-01-24 NOTE — Discharge Summary (Signed)
OB Discharge Summary     Patient Name: Latasha Fisher DOB: [birth year] MRN: [id number]  Date of admission: 01/14/2018 Delivering MD: Geryl RankinsVARNADO, EVELYN   Date of discharge: 01/16/2018  Admitting diagnosis: INDUCTION Intrauterine pregnancy: 4720w6d     Secondary diagnosis:  Active Problems:   Preeclampsia Obesity  Additional problems: none     Discharge diagnosis: Term Pregnancy Delivered and CHTN                                                                                                Post partum procedures:n/a  Augmentation: Pitocin  Complications: None  Hospital course:  Induction of Labor With Vaginal Delivery   42 y.o. yo G1P1001 at 5820w6d was admitted to the hospital 01/14/2018 for induction of labor.  Indication for induction: Gestational hypertension.  Patient had an uncomplicated labor course as follows: Membrane Rupture Time/Date: 11:02 AM ,01/14/2018   Intrapartum Procedures: Episiotomy: None [1]                                         Lacerations:  3rd degree [4];Perineal [11]  Patient had delivery of a Viable infant.  Information for the patient's newborn:  Romie MinusMoody, Quinn Elyse [[id number]][[id number]]  Delivery Method: Vaginal, Vacuum (Extractor)(Filed from Delivery Summary)   01/14/2018  Details of delivery can be found in separate delivery note.  Patient had a routine postpartum course. Patient is discharged home 01/24/18.  Physical exam  Vitals:   01/16/18 0529 01/16/18 0601 01/16/18 0900 01/16/18 1012  BP:  (!) 148/88 (!) 148/79 (!) 145/74  Pulse: 63  86 94  Resp: 18     Temp: 97.8 F (36.6 C)     TempSrc:      SpO2: 100%     Weight:      Height:       General: alert, cooperative and no distress Lochia: appropriate Uterine Fundus: firm Incision: N/A DVT Evaluation: No evidence of DVT seen on physical exam. Labs: Lab Results  Component Value Date   WBC 14.3 (H) 01/23/2018   HGB 10.1 (L) 01/23/2018   HCT 31.5 (L) 01/23/2018   MCV 75.0 (L) 01/23/2018   PLT 373  01/23/2018   CMP Latest Ref Rng & Units 01/23/2018  Glucose 65 - 99 mg/dL 086(V104(H)  BUN 6 - 20 mg/dL 15  Creatinine 7.840.44 - 6.961.00 mg/dL 2.950.99  Sodium 284135 - 132145 mmol/L 137  Potassium 3.5 - 5.1 mmol/L 3.6  Chloride 101 - 111 mmol/L 105  CO2 22 - 32 mmol/L 23  Calcium 8.9 - 10.3 mg/dL 4.4(W8.5(L)  Total Protein 6.5 - 8.1 g/dL 7.1  Total Bilirubin 0.3 - 1.2 mg/dL 1.0  Alkaline Phos 38 - 126 U/L 109  AST 15 - 41 U/L 19  ALT 14 - 54 U/L 30    Discharge instruction: per After Visit Summary and "Baby and Me Booklet".  After visit meds:  Allergies as of 01/16/2018   No Known Allergies     Medication List    STOP taking  these medications   aspirin EC 81 MG tablet     TAKE these medications   calcium carbonate 500 MG chewable tablet Commonly known as:  TUMS - dosed in mg elemental calcium Chew 1 tablet by mouth 2 (two) times daily as needed for indigestion or heartburn.   docusate sodium 100 MG capsule Commonly known as:  COLACE Take 1 capsule (100 mg total) by mouth 2 (two) times daily.   FUSION PLUS Caps Take 1 capsule by mouth daily.   ibuprofen 600 MG tablet Commonly known as:  ADVIL,MOTRIN Take 1 tablet (600 mg total) by mouth every 6 (six) hours.   prenatal multivitamin Tabs tablet Take 1 tablet by mouth daily at 12 noon.       Diet: low salt diet  Activity: Advance as tolerated. Pelvic rest for 6 weeks.   Outpatient follow up:1 week Follow up Appt:No future appointments. Follow up Visit:No Follow-up on file.  Postpartum contraception: Not Discussed  Newborn Data: Live born female  Birth Weight: 6 lb 11.9 oz (3060 g) APGAR: 8, 9  Newborn Delivery   Birth date/time:  01/14/2018 20:19:00 Delivery type:  Vaginal, Vacuum (Extractor)     Baby Feeding: Breast Disposition:home with mother   01/24/2018 Sharon Seller, DO

## 2018-01-24 NOTE — Progress Notes (Signed)
Discharge instructions reviewed with patient and significant other.questions asked and answered.  Saline lock removed.

## 2018-01-24 NOTE — Discharge Summary (Signed)
Physician Discharge Summary  Patient ID: Latasha Fisher MRN: [id number] DOB/AGE: 1976-04-13 42 y.o.  Admit date: 01/23/2018 Discharge date: 01/24/2018  Admission Diagnoses:chronic hyertension and preeclampsia in postpartum period   Discharge Diagnoses:  Active Problems:   Preeclampsia in postpartum period   Discharged Condition: stable  Hospital Course: pt received magnesium sulfate for 24 hours with improvement of bp . Started on norvasc 5 m daily   Consults: None  Significant Diagnostic Studies: labs: Results for orders placed or performed during the hospital encounter of 01/23/18 (from the past 48 hour(s))  Comprehensive metabolic panel     Status: Abnormal   Collection Time: 01/23/18  2:38 AM  Result Value Ref Range   Sodium 137 135 - 145 mmol/L   Potassium 3.6 3.5 - 5.1 mmol/L   Chloride 105 101 - 111 mmol/L   CO2 23 22 - 32 mmol/L   Glucose, Bld 104 (H) 65 - 99 mg/dL   BUN 15 6 - 20 mg/dL   Creatinine, Ser 0.99 0.44 - 1.00 mg/dL   Calcium 8.5 (L) 8.9 - 10.3 mg/dL   Total Protein 7.1 6.5 - 8.1 g/dL   Albumin 3.3 (L) 3.5 - 5.0 g/dL   AST 19 15 - 41 U/L   ALT 30 14 - 54 U/L   Alkaline Phosphatase 109 38 - 126 U/L   Total Bilirubin 1.0 0.3 - 1.2 mg/dL   GFR calc non Af Amer >60 >60 mL/min   GFR calc Af Amer >60 >60 mL/min    Comment: (NOTE) The eGFR has been calculated using the CKD EPI equation. This calculation has not been validated in all clinical situations. eGFR's persistently <60 mL/min signify possible Chronic Kidney Disease.    Anion gap 9 5 - 15    Comment: Performed at [facility], [address], [place], [place] [address]  CBC     Status: Abnormal   Collection Time: 01/23/18  2:38 AM  Result Value Ref Range   WBC 14.3 (H) 4.0 - 10.5 K/uL   RBC 4.20 3.87 - 5.11 MIL/uL   Hemoglobin 10.1 (L) 12.0 - 15.0 g/dL   HCT 31.5 (L) 36.0 - 46.0 %   MCV 75.0 (L) 78.0 - 100.0 fL   MCH 24.0 (L) 26.0 - 34.0 pg   MCHC 32.1 30.0 - 36.0 g/dL   RDW 14.9 11.5 - 15.5  %   Platelets 373 150 - 400 K/uL    Comment: Performed at [facility], [address], [place],  [address]  Treatments: Magnesium sulfate IV  Disposition: 01-Home or Self Care  Discharge Instructions    Activity as tolerated   Complete by:  As directed    Call MD for:  difficulty breathing, headache or visual disturbances   Complete by:  As directed    Call MD for:  persistant nausea and vomiting   Complete by:  As directed    Call MD for:  severe uncontrolled pain   Complete by:  As directed    Call MD for:  temperature >100.4   Complete by:  As directed    Diet - low sodium heart healthy   Complete by:  As directed    Sexual acrtivity   Complete by:  As directed    Avoid sex for 6 weeks     Allergies as of 01/24/2018   No Known Allergies     Medication List    STOP taking these medications   NIFEdipine 30 MG 24 hr tablet Commonly known as:  PROCARDIA-XL/ADALAT-CC/NIFEDICAL-XL     TAKE these medications   amLODipine 5 MG tablet Commonly known as:  NORVASC Take 1 tablet (5 mg total) by mouth daily. Start taking on:  01/25/2018   calcium carbonate 500 MG chewable tablet Commonly known as:  TUMS - dosed in mg elemental calcium Chew 1 tablet by mouth 2 (two) times daily as needed for indigestion or heartburn.   docusate sodium 100 MG capsule Commonly known as:  COLACE Take 1 capsule (100 mg total) by mouth 2 (two) times daily.   FUSION PLUS Caps Take 1 capsule by mouth daily.   ibuprofen 600 MG tablet Commonly known as:  ADVIL,MOTRIN Take 1 tablet (600 mg total) by mouth every 6 (six) hours.   prenatal multivitamin Tabs tablet Take 1 tablet by mouth daily at 12 noon.      Follow-up Information    Thurnell Lose, MD. Schedule an appointment as soon as possible for a visit in 4 day(s).   Specialty:  Obstetrics and Gynecology Why:  blood pressure check  Contact information: [address] [place] [address] [phone]            Signed: Catha Brow. 01/24/2018, 7:18 PM

## 2018-05-14 DIAGNOSIS — M654 Radial styloid tenosynovitis [de Quervain]: Secondary | ICD-10-CM | POA: Diagnosis not present

## 2018-05-14 DIAGNOSIS — I10 Essential (primary) hypertension: Secondary | ICD-10-CM | POA: Diagnosis not present

## 2018-05-14 DIAGNOSIS — D649 Anemia, unspecified: Secondary | ICD-10-CM | POA: Diagnosis not present

## 2018-08-19 DIAGNOSIS — K080 Exfoliation of teeth due to systemic causes: Secondary | ICD-10-CM | POA: Diagnosis not present

## 2018-09-19 DIAGNOSIS — D508 Other iron deficiency anemias: Secondary | ICD-10-CM | POA: Diagnosis not present

## 2018-09-19 DIAGNOSIS — I10 Essential (primary) hypertension: Secondary | ICD-10-CM | POA: Diagnosis not present

## 2018-11-12 DIAGNOSIS — D519 Vitamin B12 deficiency anemia, unspecified: Secondary | ICD-10-CM | POA: Diagnosis not present

## 2018-11-12 DIAGNOSIS — I10 Essential (primary) hypertension: Secondary | ICD-10-CM | POA: Diagnosis not present

## 2018-11-12 DIAGNOSIS — D508 Other iron deficiency anemias: Secondary | ICD-10-CM | POA: Diagnosis not present

## 2018-11-12 DIAGNOSIS — D649 Anemia, unspecified: Secondary | ICD-10-CM | POA: Diagnosis not present

## 2018-11-18 DIAGNOSIS — N390 Urinary tract infection, site not specified: Secondary | ICD-10-CM | POA: Diagnosis not present

## 2019-08-28 DIAGNOSIS — Z01419 Encounter for gynecological examination (general) (routine) without abnormal findings: Secondary | ICD-10-CM | POA: Diagnosis not present

## 2019-09-21 DIAGNOSIS — I10 Essential (primary) hypertension: Secondary | ICD-10-CM | POA: Diagnosis not present

## 2019-09-21 DIAGNOSIS — D508 Other iron deficiency anemias: Secondary | ICD-10-CM | POA: Diagnosis not present

## 2019-09-24 DIAGNOSIS — I10 Essential (primary) hypertension: Secondary | ICD-10-CM | POA: Diagnosis not present

## 2019-09-24 DIAGNOSIS — D508 Other iron deficiency anemias: Secondary | ICD-10-CM | POA: Diagnosis not present

## 2020-02-29 DIAGNOSIS — Z3041 Encounter for surveillance of contraceptive pills: Secondary | ICD-10-CM | POA: Diagnosis not present

## 2020-02-29 DIAGNOSIS — Z3202 Encounter for pregnancy test, result negative: Secondary | ICD-10-CM | POA: Diagnosis not present

## 2020-02-29 DIAGNOSIS — N912 Amenorrhea, unspecified: Secondary | ICD-10-CM | POA: Diagnosis not present

## 2020-08-30 DIAGNOSIS — Z0000 Encounter for general adult medical examination without abnormal findings: Secondary | ICD-10-CM | POA: Diagnosis not present

## 2021-02-07 DIAGNOSIS — I10 Essential (primary) hypertension: Secondary | ICD-10-CM | POA: Diagnosis not present

## 2021-02-07 DIAGNOSIS — E669 Obesity, unspecified: Secondary | ICD-10-CM | POA: Diagnosis not present

## 2021-02-07 DIAGNOSIS — D508 Other iron deficiency anemias: Secondary | ICD-10-CM | POA: Diagnosis not present

## 2021-04-06 DIAGNOSIS — D649 Anemia, unspecified: Secondary | ICD-10-CM | POA: Diagnosis not present

## 2021-04-28 ENCOUNTER — Telehealth: Payer: Self-pay | Admitting: Physician Assistant

## 2021-04-28 NOTE — Telephone Encounter (Signed)
Received a new hem referral from Dr. Hyacinth Meeker for IDA> MS. Hedstrom has been cld and scheduled to see Karena Addison on 6/7 at 9am. Pt aware to arrive 20 minutes early.

## 2021-05-02 ENCOUNTER — Inpatient Hospital Stay: Payer: Federal, State, Local not specified - PPO | Admitting: Physician Assistant

## 2021-05-02 ENCOUNTER — Inpatient Hospital Stay: Payer: Federal, State, Local not specified - PPO

## 2021-05-08 ENCOUNTER — Telehealth: Payer: Self-pay | Admitting: Physician Assistant

## 2021-05-08 NOTE — Telephone Encounter (Signed)
R/s new patient appt due to provider being out of office. Called and spoke with patient. Confirmed new date and time

## 2021-05-09 ENCOUNTER — Encounter: Payer: Federal, State, Local not specified - PPO | Admitting: Physician Assistant

## 2021-05-09 ENCOUNTER — Other Ambulatory Visit: Payer: Federal, State, Local not specified - PPO

## 2021-05-23 ENCOUNTER — Other Ambulatory Visit: Payer: Federal, State, Local not specified - PPO

## 2021-05-23 ENCOUNTER — Inpatient Hospital Stay: Payer: Federal, State, Local not specified - PPO | Attending: Physician Assistant | Admitting: Physician Assistant

## 2021-05-23 ENCOUNTER — Other Ambulatory Visit: Payer: Self-pay

## 2021-05-23 ENCOUNTER — Encounter: Payer: Federal, State, Local not specified - PPO | Admitting: Physician Assistant

## 2021-05-23 ENCOUNTER — Inpatient Hospital Stay: Payer: Federal, State, Local not specified - PPO

## 2021-05-23 ENCOUNTER — Encounter: Payer: Self-pay | Admitting: Physician Assistant

## 2021-05-23 VITALS — BP 131/81 | HR 69 | Temp 97.8°F | Resp 18 | Ht 67.0 in | Wt 200.4 lb

## 2021-05-23 DIAGNOSIS — Z833 Family history of diabetes mellitus: Secondary | ICD-10-CM | POA: Diagnosis not present

## 2021-05-23 DIAGNOSIS — Z79899 Other long term (current) drug therapy: Secondary | ICD-10-CM | POA: Diagnosis not present

## 2021-05-23 DIAGNOSIS — D72829 Elevated white blood cell count, unspecified: Secondary | ICD-10-CM

## 2021-05-23 DIAGNOSIS — D509 Iron deficiency anemia, unspecified: Secondary | ICD-10-CM

## 2021-05-23 DIAGNOSIS — Z803 Family history of malignant neoplasm of breast: Secondary | ICD-10-CM | POA: Diagnosis not present

## 2021-05-23 DIAGNOSIS — I10 Essential (primary) hypertension: Secondary | ICD-10-CM | POA: Insufficient documentation

## 2021-05-23 DIAGNOSIS — Z8249 Family history of ischemic heart disease and other diseases of the circulatory system: Secondary | ICD-10-CM | POA: Diagnosis not present

## 2021-05-23 LAB — CMP (CANCER CENTER ONLY)
ALT: 9 U/L (ref 0–44)
AST: 11 U/L — ABNORMAL LOW (ref 15–41)
Albumin: 3.9 g/dL (ref 3.5–5.0)
Alkaline Phosphatase: 70 U/L (ref 38–126)
Anion gap: 11 (ref 5–15)
BUN: 11 mg/dL (ref 6–20)
CO2: 24 mmol/L (ref 22–32)
Calcium: 9.6 mg/dL (ref 8.9–10.3)
Chloride: 106 mmol/L (ref 98–111)
Creatinine: 0.96 mg/dL (ref 0.44–1.00)
GFR, Estimated: >60 mL/min (ref >60)
Glucose, Bld: 81 mg/dL (ref 70–99)
Potassium: 3.6 mmol/L (ref 3.5–5.1)
Sodium: 141 mmol/L (ref 135–145)
Total Bilirubin: 0.7 mg/dL (ref 0.3–1.2)
Total Protein: 8.1 g/dL (ref 6.5–8.1)

## 2021-05-23 LAB — FOLATE: Folate: 14.3 ng/mL (ref >5.9)

## 2021-05-23 LAB — CBC WITH DIFFERENTIAL (CANCER CENTER ONLY)
Abs Immature Granulocytes: 0.03 10*3/uL (ref 0.00–0.07)
Basophils Absolute: 0 10*3/uL (ref 0.0–0.1)
Basophils Relative: 0 %
Eosinophils Absolute: 0.1 10*3/uL (ref 0.0–0.5)
Eosinophils Relative: 1 %
HCT: 35.2 % — ABNORMAL LOW (ref 36.0–46.0)
Hemoglobin: 10.9 g/dL — ABNORMAL LOW (ref 12.0–15.0)
Immature Granulocytes: 0 %
Lymphocytes Relative: 33 %
Lymphs Abs: 3.9 10*3/uL (ref 0.7–4.0)
MCH: 23.3 pg — ABNORMAL LOW (ref 26.0–34.0)
MCHC: 31 g/dL (ref 30.0–36.0)
MCV: 75.2 fL — ABNORMAL LOW (ref 80.0–100.0)
Monocytes Absolute: 0.5 10*3/uL (ref 0.1–1.0)
Monocytes Relative: 4 %
Neutro Abs: 7.4 10*3/uL (ref 1.7–7.7)
Neutrophils Relative %: 62 %
Platelet Count: 411 10*3/uL — ABNORMAL HIGH (ref 150–400)
RBC: 4.68 MIL/uL (ref 3.87–5.11)
RDW: 14.1 % (ref 11.5–15.5)
WBC Count: 12 10*3/uL — ABNORMAL HIGH (ref 4.0–10.5)
nRBC: 0 % (ref 0.0–0.2)

## 2021-05-23 LAB — RETIC PANEL
Immature Retic Fract: 16.4 % — ABNORMAL HIGH (ref 2.3–15.9)
RBC.: 4.63 MIL/uL (ref 3.87–5.11)
Retic Count, Absolute: 56.5 10*3/uL (ref 19.0–186.0)
Retic Ct Pct: 1.2 % (ref 0.4–3.1)
Reticulocyte Hemoglobin: 26.8 pg — ABNORMAL LOW (ref >27.9)

## 2021-05-23 LAB — VITAMIN B12: Vitamin B-12: 954 pg/mL — ABNORMAL HIGH (ref 180–914)

## 2021-05-23 NOTE — Progress Notes (Signed)
[facility] [phone]   [phone]  INITIAL CONSULT NOTE  Patient Care Team: Sigmund Hazel, MD as PCP - General (Family Medicine)  Hematological/Oncological History 1) Labs from PCP, Dr. Sigmund Hazel at [facility] -12/18/019: WBC 8.8, Hgb 11.0 (L), MCV 74.3 (L), Plt 378, Ferritin 88, Iron Saturation 24%, TIBC 359, Vitmain B12 273. -11/14/2018: Hemoglobin electrophoresis: Normal adult hemoglobin present.  -09/24/2019: WBC 11.1 (H), Hgb 10.9 (L), MCV 73.3 (L), Plt 412 (H) -02/08/2021: WBC 8.0, Hgb 10.8 (L), MCV 74.3 (L), Plt 376 -04/06/2021: WBC 9.1, Hgb 11.2 (L), MCV 73.7 (L), Plt 377  2) 05/23/2021: Establish care with Georga Kaufmann PA-C   CHIEF COMPLAINTS/PURPOSE OF CONSULTATION:  "Microcytic Anemia"  HISTORY OF PRESENTING ILLNESS:  Latasha Fisher 45 y.o. female with medical history significant for hypertension that presents to the clinic for evaluation for microcytic anemia. Patient is unaccompanied for this visit. She reports that she has mild fatigue but continues to complete all her ADLs on her own. She has a good appetite without any dietary restrictions. Patient denies any nausea, vomiting or abdominal pain. Her bowel movements are regular without any diarrhea or constipation. Patient denies any easy bruising or signs of bleeding. This includes monthly menstrual cycle since patient is on birth control since 2021. Patient denies any fevers, chills, night sweats, shortness of breath, chest pain or cough. She has no other complaints.   MEDICAL HISTORY:  Past Medical History:  Diagnosis Date   Hypertension    Medical history non-contributory     SURGICAL HISTORY: Past Surgical History:  Procedure Laterality Date   WISDOM TOOTH EXTRACTION      SOCIAL HISTORY: Social History   Socioeconomic History   Marital status: Married    Spouse name: Not on file   Number of children: Not on file   Years of education: Not on file   Highest  education level: Not on file  Occupational History   Not on file  Tobacco Use   Smoking status: Never   Smokeless tobacco: Never  Substance and Sexual Activity   Alcohol use: Yes    Comment: socially   Drug use: No   Sexual activity: Yes  Other Topics Concern   Not on file  Social History Narrative   Not on file   Social Determinants of Health   Financial Resource Strain: Not on file  Food Insecurity: Not on file  Transportation Needs: Not on file  Physical Activity: Not on file  Stress: Not on file  Social Connections: Not on file  Intimate Partner Violence: Not on file    FAMILY HISTORY: Family History  Problem Relation Age of Onset   Hypertension Mother    Diabetes Mother    Diabetes Father    Cancer Maternal Grandmother    Breast cancer Maternal Aunt     ALLERGIES:  has No Known Allergies.  MEDICATIONS:  Current Outpatient Medications  Medication Sig Dispense Refill   amLODipine (NORVASC) 5 MG tablet Take 1 tablet (5 mg total) by mouth daily. 30 tablet 1   calcium carbonate (TUMS - DOSED IN MG ELEMENTAL CALCIUM) 500 MG chewable tablet Chew 1 tablet by mouth 2 (two) times daily as needed for indigestion or heartburn.     Iron-FA-B Cmp-C-Biot-Probiotic (FUSION PLUS) CAPS Take 1 capsule by mouth daily.   2   No current facility-administered medications for this visit.    REVIEW OF SYSTEMS:   Constitutional: ( - ) fevers, ( - )  chills , ( - )  night sweats Eyes: ( - ) blurriness of vision, ( - ) double vision, ( - ) watery eyes Ears, nose, mouth, throat, and face: ( - ) mucositis, ( - ) sore throat Respiratory: ( - ) cough, ( - ) dyspnea, ( - ) wheezes Cardiovascular: ( - ) palpitation, ( - ) chest discomfort, ( - ) lower extremity swelling Gastrointestinal:  ( - ) nausea, ( - ) heartburn, ( - ) change in bowel habits Skin: ( - ) abnormal skin rashes Lymphatics: ( - ) new lymphadenopathy, ( - ) easy bruising Neurological: ( - ) numbness, ( - ) tingling, ( - )  new weaknesses Behavioral/Psych: ( - ) mood change, ( - ) new changes  All other systems were reviewed with the patient and are negative.  PHYSICAL EXAMINATION: ECOG PERFORMANCE STATUS: 0 - Asymptomatic  Vitals:   05/23/21 1515  BP: 131/81  Pulse: 69  Resp: 18  Temp: 97.8 F (36.6 C)  SpO2: 100%   Filed Weights   05/23/21 1515  Weight: 200 lb 6.4 oz (90.9 kg)    GENERAL: well appearing African American female in NAD  SKIN: skin color, texture, turgor are normal, no rashes or significant lesions EYES: conjunctiva are pink and non-injected, sclera clear OROPHARYNX: no exudate, no erythema; lips, buccal mucosa, and tongue normal  NECK: supple, non-tender LYMPH:  no palpable lymphadenopathy in the cervical, axillary or supraclavicular lymph nodes.  LUNGS: clear to auscultation and percussion with normal breathing effort HEART: regular rate & rhythm and no murmurs and no lower extremity edema ABDOMEN: soft, non-tender, non-distended, normal bowel sounds Musculoskeletal: no cyanosis of digits and no clubbing  PSYCH: alert & oriented x 3, fluent speech NEURO: no focal motor/sensory deficits  LABORATORY DATA:  I have reviewed the data as listed CBC Latest Ref Rng & Units 05/23/2021 01/23/2018 01/15/2018  WBC 4.0 - 10.5 K/uL 12.0(H) 14.3(H) 21.7(H)  Hemoglobin 12.0 - 15.0 g/dL 10.9(L) 10.1(L) 9.0(L)  Hematocrit 36.0 - 46.0 % 35.2(L) 31.5(L) 27.4(L)  Platelets 150 - 400 K/uL 411(H) 373 264    CMP Latest Ref Rng & Units 01/23/2018 01/14/2018 01/14/2018  Glucose 65 - 99 mg/dL 536(R) 90 86  BUN 6 - 20 mg/dL 15 8 8   Creatinine 0.44 - 1.00 mg/dL 4.43 1.54  Sodium 135 - 145 mmol/L 137 134(L) 135  Potassium 3.5 - 5.1 mmol/L 3.6 3.9 3.9  Chloride 101 - 111 mmol/L 105 106 106  CO2 22 - 32 mmol/L 23 19(L) 21(L)  Calcium 8.9 - 10.3 mg/dL 0.08) 6.7(Y) 9.6  Total Protein 6.5 - 8.1 g/dL 7.1 6.8 1.9(J)  Total Bilirubin 0.3 - 1.2 mg/dL 1.0 1.1 0.6  Alkaline Phos 38 - 126 U/L 109 196(H)  181(H)  AST 15 - 41 U/L 19 24 21   ALT 14 - 54 U/L 30 16 15     ASSESSMENT & PLAN Latasha Fisher is a 45 y.o. female presenting to the clinic for evaluation for microcytic anemia. I reviewed most recent outside labs from 04/06/2021 that revealed mild anemia with Hgb 11.2 and MCV 73.7. Patient has been taking oral iron off and on for several years. She is currently not taking any iron supplementation. I recommend proceeding with labs to check CBC, CMP, iron and TIBC, ferritin, retic panel, B12 and folate levels.   #Microcytic anemia: -Uncertain etiology but will proceed with labs to further evaluate. Will check CBC, CMP, iron and TIBC, ferritin, retic panel, B12 and folate levels.  -If there is evidence of  iron deficiency anemia, recommend to resume oral iron, ferrous sulfate 325 mg once daily. In addition, need to refer to GI to rule out GI bleed since patient does not have monthly menstrual cycle since starting birth control in 2021.  -RTC in 3 months with labs.   Orders Placed This Encounter  Procedures   CBC with Differential (Cancer Center Only)    Standing Status:   Future    Number of Occurrences:   1    Standing Expiration Date:   05/23/2022   CMP (Cancer Center only)    Standing Status:   Future    Number of Occurrences:   1    Standing Expiration Date:   05/23/2022   Ferritin    Standing Status:   Future    Number of Occurrences:   1    Standing Expiration Date:   05/23/2022   Iron and TIBC    Standing Status:   Future    Number of Occurrences:   1    Standing Expiration Date:   05/23/2022   Retic Panel    Standing Status:   Future    Number of Occurrences:   1    Standing Expiration Date:   05/23/2022   Vitamin B12    Standing Status:   Future    Number of Occurrences:   1    Standing Expiration Date:   05/23/2022   Folate, Serum    Standing Status:   Future    Number of Occurrences:   1    Standing Expiration Date:   05/23/2022    All questions were answered. The patient  knows to call the clinic with any problems, questions or concerns.  I have spent a total of 60 minutes minutes of face-to-face and non-face-to-face time, preparing to see the patient, obtaining and/or reviewing separately obtained history, performing a medically appropriate examination, counseling and educating the patient, ordering tests, documenting clinical information in the electronic health record, and care coordination.   Georga Kaufmann, PA-C Department of Hematology/Oncology [facility] Phone: [phone]

## 2021-05-24 LAB — IRON AND TIBC
Iron: 56 ug/dL (ref 41–142)
Saturation Ratios: 16 % — ABNORMAL LOW (ref 21–57)
TIBC: 354 ug/dL (ref 236–444)
UIBC: 297 ug/dL (ref 120–384)

## 2021-05-24 LAB — FERRITIN: Ferritin: 215 ng/mL (ref 11–307)

## 2021-05-25 ENCOUNTER — Encounter: Payer: Self-pay | Admitting: Physician Assistant

## 2021-05-25 ENCOUNTER — Telehealth: Payer: Self-pay | Admitting: Physician Assistant

## 2021-05-25 DIAGNOSIS — D509 Iron deficiency anemia, unspecified: Secondary | ICD-10-CM | POA: Insufficient documentation

## 2021-05-25 DIAGNOSIS — D508 Other iron deficiency anemias: Secondary | ICD-10-CM

## 2021-05-25 MED ORDER — FERROUS SULFATE 325 (65 FE) MG PO TBEC: 325.0000 mg | DELAYED_RELEASE_TABLET | Freq: Every day | ORAL | 3 refills | Status: AC

## 2021-05-25 NOTE — Telephone Encounter (Signed)
I called [name] to review the labs from 05/23/2021. CBC revealed elevated WBC at 12.0, microcytic anemia with hemoglobin of 10.9 and MCV of 75.2 and mild thrombocytopenia with platelet count of 411K.   Iron panel indicates iron deficiency anemia with iron saturation of 16%. B12 and folate levels did not indicate deficiency.   I recommend for patient to start ferrous sulfate 325 mg once daily with a source of vitamin C. In addition, I recommended to avoid taking oral iron concurrently with antacids or calcium supplementation. Sent prescription to local pharmacy.   Patient no longer has a monthly menstrual cycle since starting birth control in 2021. Recommend referral to gastroenterology to evaluate for GI bleed or malabsorptive conditions.   Regarding leukocytosis, we will ask patient to return next Tuesday, 05/30/2021 for labs to check ESR and Sed Rate. Patient denies any infectious symptoms including fevers, chills, sweats, cough, dysuria.   I will see the patient back in clinic in 3 months with repeat labs.   Patient expressed understanding and satisfaction with the plan provided.

## 2021-05-26 ENCOUNTER — Other Ambulatory Visit: Payer: Self-pay

## 2021-05-26 DIAGNOSIS — D508 Other iron deficiency anemias: Secondary | ICD-10-CM

## 2021-05-30 ENCOUNTER — Other Ambulatory Visit: Payer: Self-pay

## 2021-05-30 ENCOUNTER — Telehealth: Payer: Self-pay | Admitting: Physician Assistant

## 2021-05-30 ENCOUNTER — Inpatient Hospital Stay: Payer: Federal, State, Local not specified - PPO | Attending: Physician Assistant

## 2021-05-30 DIAGNOSIS — D72829 Elevated white blood cell count, unspecified: Secondary | ICD-10-CM

## 2021-05-30 DIAGNOSIS — D509 Iron deficiency anemia, unspecified: Secondary | ICD-10-CM | POA: Insufficient documentation

## 2021-05-30 DIAGNOSIS — D508 Other iron deficiency anemias: Secondary | ICD-10-CM

## 2021-05-30 LAB — CBC WITH DIFFERENTIAL (CANCER CENTER ONLY)
Abs Immature Granulocytes: 0.03 10*3/uL (ref 0.00–0.07)
Basophils Absolute: 0 10*3/uL (ref 0.0–0.1)
Basophils Relative: 0 %
Eosinophils Absolute: 0.2 10*3/uL (ref 0.0–0.5)
Eosinophils Relative: 2 %
HCT: 35.7 % — ABNORMAL LOW (ref 36.0–46.0)
Hemoglobin: 11 g/dL — ABNORMAL LOW (ref 12.0–15.0)
Immature Granulocytes: 0 %
Lymphocytes Relative: 34 %
Lymphs Abs: 3.2 10*3/uL (ref 0.7–4.0)
MCH: 23.4 pg — ABNORMAL LOW (ref 26.0–34.0)
MCHC: 30.8 g/dL (ref 30.0–36.0)
MCV: 75.8 fL — ABNORMAL LOW (ref 80.0–100.0)
Monocytes Absolute: 0.5 10*3/uL (ref 0.1–1.0)
Monocytes Relative: 5 %
Neutro Abs: 5.5 10*3/uL (ref 1.7–7.7)
Neutrophils Relative %: 59 %
Platelet Count: 384 10*3/uL (ref 150–400)
RBC: 4.71 MIL/uL (ref 3.87–5.11)
RDW: 14 % (ref 11.5–15.5)
WBC Count: 9.5 10*3/uL (ref 4.0–10.5)
nRBC: 0 % (ref 0.0–0.2)

## 2021-05-30 LAB — CMP (CANCER CENTER ONLY)
ALT: 11 U/L (ref 0–44)
AST: 12 U/L — ABNORMAL LOW (ref 15–41)
Albumin: 3.7 g/dL (ref 3.5–5.0)
Alkaline Phosphatase: 67 U/L (ref 38–126)
Anion gap: 8 (ref 5–15)
BUN: 6 mg/dL (ref 6–20)
CO2: 23 mmol/L (ref 22–32)
Calcium: 9.2 mg/dL (ref 8.9–10.3)
Chloride: 109 mmol/L (ref 98–111)
Creatinine: 0.92 mg/dL (ref 0.44–1.00)
GFR, Estimated: >60 mL/min (ref >60)
Glucose, Bld: 104 mg/dL — ABNORMAL HIGH (ref 70–99)
Potassium: 4 mmol/L (ref 3.5–5.1)
Sodium: 140 mmol/L (ref 135–145)
Total Bilirubin: 0.3 mg/dL (ref 0.3–1.2)
Total Protein: 8 g/dL (ref 6.5–8.1)

## 2021-05-30 LAB — SEDIMENTATION RATE: Sed Rate: 15 mm/hr (ref 0–22)

## 2021-05-30 LAB — C-REACTIVE PROTEIN: CRP: 1.2 mg/dL — ABNORMAL HIGH (ref <1.0)

## 2021-05-30 NOTE — Telephone Encounter (Signed)
I called [name] to review the lab results from today, 05/30/2021. Repeat CBC shows mild anemia with hemoglobin of 11.0. WBC and Plt count are back to normal. ESR and Sed rate did not suggest an inflammatory process for previously elevated WBC count. Not further intervention is required except for taking oral iron supplementation as previously recommended. Patient will return in 3 months with repeat labs. Patient expressed understanding of the plan provided.

## 2021-05-31 ENCOUNTER — Telehealth: Payer: Self-pay | Admitting: Physician Assistant

## 2021-05-31 NOTE — Telephone Encounter (Signed)
Sch per 06/28 los , pt aware

## 2021-08-23 ENCOUNTER — Other Ambulatory Visit: Payer: Self-pay

## 2021-08-23 ENCOUNTER — Other Ambulatory Visit: Payer: Federal, State, Local not specified - PPO

## 2021-08-23 ENCOUNTER — Inpatient Hospital Stay: Payer: Federal, State, Local not specified - PPO | Attending: Physician Assistant

## 2021-08-23 ENCOUNTER — Inpatient Hospital Stay: Payer: Federal, State, Local not specified - PPO | Admitting: Physician Assistant

## 2021-08-23 ENCOUNTER — Ambulatory Visit: Payer: Federal, State, Local not specified - PPO | Admitting: Physician Assistant

## 2021-08-23 ENCOUNTER — Other Ambulatory Visit: Payer: Self-pay | Admitting: Physician Assistant

## 2021-08-23 VITALS — BP 138/88 | HR 68 | Temp 98.3°F | Resp 17 | Ht 67.0 in | Wt 204.4 lb

## 2021-08-23 DIAGNOSIS — Z79899 Other long term (current) drug therapy: Secondary | ICD-10-CM | POA: Insufficient documentation

## 2021-08-23 DIAGNOSIS — D508 Other iron deficiency anemias: Secondary | ICD-10-CM

## 2021-08-23 DIAGNOSIS — D509 Iron deficiency anemia, unspecified: Secondary | ICD-10-CM | POA: Diagnosis not present

## 2021-08-23 DIAGNOSIS — I10 Essential (primary) hypertension: Secondary | ICD-10-CM | POA: Diagnosis not present

## 2021-08-23 LAB — CBC WITH DIFFERENTIAL (CANCER CENTER ONLY)
Abs Immature Granulocytes: 0.02 10*3/uL (ref 0.00–0.07)
Basophils Absolute: 0.1 10*3/uL (ref 0.0–0.1)
Basophils Relative: 1 %
Eosinophils Absolute: 0.1 10*3/uL (ref 0.0–0.5)
Eosinophils Relative: 1 %
HCT: 34.8 % — ABNORMAL LOW (ref 36.0–46.0)
Hemoglobin: 10.8 g/dL — ABNORMAL LOW (ref 12.0–15.0)
Immature Granulocytes: 0 %
Lymphocytes Relative: 33 %
Lymphs Abs: 3.1 10*3/uL (ref 0.7–4.0)
MCH: 23.5 pg — ABNORMAL LOW (ref 26.0–34.0)
MCHC: 31 g/dL (ref 30.0–36.0)
MCV: 75.8 fL — ABNORMAL LOW (ref 80.0–100.0)
Monocytes Absolute: 0.5 10*3/uL (ref 0.1–1.0)
Monocytes Relative: 5 %
Neutro Abs: 5.6 10*3/uL (ref 1.7–7.7)
Neutrophils Relative %: 60 %
Platelet Count: 368 10*3/uL (ref 150–400)
RBC: 4.59 MIL/uL (ref 3.87–5.11)
RDW: 13.8 % (ref 11.5–15.5)
WBC Count: 9.4 10*3/uL (ref 4.0–10.5)
nRBC: 0 % (ref 0.0–0.2)

## 2021-08-23 LAB — FERRITIN: Ferritin: 146 ng/mL (ref 11–307)

## 2021-08-23 LAB — IRON AND TIBC
Iron: 77 ug/dL (ref 41–142)
Saturation Ratios: 23 % (ref 21–57)
TIBC: 340 ug/dL (ref 236–444)
UIBC: 263 ug/dL (ref 120–384)

## 2021-08-23 NOTE — Progress Notes (Signed)
[facility] [phone]   [phone]  PROGRESS NOTE  Patient Care Team: Latasha Hazel, MD as PCP - General (Family Medicine)  Hematological/Oncological History 1) Labs from PCP, Dr. Sigmund Fisher at [facility] -12/18/019: WBC 8.8, Hgb 11.0 (L), MCV 74.3 (L), Plt 378, Ferritin 88, Iron Saturation 24%, TIBC 359, Vitamin B12 273. -11/14/2018: Hemoglobin electrophoresis: Normal adult hemoglobin present.  -09/24/2019: WBC 11.1 (H), Hgb 10.9 (L), MCV 73.3 (L), Plt 412 (H) -02/08/2021: WBC 8.0, Hgb 10.8 (L), MCV 74.3 (L), Plt 376 -04/06/2021: WBC 9.1, Hgb 11.2 (L), MCV 73.7 (L), Plt 377  2) 05/23/2021: Establish care with Latasha Kaufmann PA-C -Iron panel showed iron saturation of 16% so recommend ferrous sulfate 325 mg once daily -No evidence of vitamin B12 or folate deficiency.   CHIEF COMPLAINTS/PURPOSE OF CONSULTATION:  "Microcytic Anemia"  HISTORY OF PRESENTING ILLNESS:  Latasha Fisher 45 y.o. female returns for microcytic anemia. Patient is unaccompanied for this visit. She reports that her energy levels are stable. She continues to exercise regularly and can complete her activities on her own. She has a good appetite and denies any weight changes. She denies any nausea, vomiting or abdominal pain. Her bowel movements are regular without any diarrhea or constipation. Patient denies any easy bruising or signs of bleeding. This includes monthly menstrual cycle since patient is on birth control since 2021. Patient denies any fevers, chills, night sweats, shortness of breath, chest pain or cough. She has no other complaints. Rest of the 10 point ROS is below.    MEDICAL HISTORY:  Past Medical History:  Diagnosis Date   Hypertension    Medical history non-contributory     SURGICAL HISTORY: Past Surgical History:  Procedure Laterality Date   WISDOM TOOTH EXTRACTION      SOCIAL HISTORY: Social History   Socioeconomic History   Marital status: Married     Spouse name: Not on file   Number of children: Not on file   Years of education: Not on file   Highest education level: Not on file  Occupational History   Not on file  Tobacco Use   Smoking status: Never   Smokeless tobacco: Never  Substance and Sexual Activity   Alcohol use: Yes    Comment: socially   Drug use: No   Sexual activity: Yes  Other Topics Concern   Not on file  Social History Narrative   Not on file   Social Determinants of Health   Financial Resource Strain: Not on file  Food Insecurity: Not on file  Transportation Needs: Not on file  Physical Activity: Not on file  Stress: Not on file  Social Connections: Not on file  Intimate Partner Violence: Not on file    FAMILY HISTORY: Family History  Problem Relation Age of Onset   Hypertension Mother    Diabetes Mother    Diabetes Father    Cancer Maternal Grandmother    Breast cancer Maternal Aunt     ALLERGIES:  has No Known Allergies.  MEDICATIONS:  Current Outpatient Medications  Medication Sig Dispense Refill   amLODipine (NORVASC) 5 MG tablet Take 1 tablet (5 mg total) by mouth daily. 30 tablet 1   calcium carbonate (TUMS - DOSED IN MG ELEMENTAL CALCIUM) 500 MG chewable tablet Chew 1 tablet by mouth 2 (two) times daily as needed for indigestion or heartburn.     ferrous sulfate 325 (65 FE) MG EC tablet Take 1 tablet (325 mg total) by mouth daily with breakfast.  30 tablet 3   No current facility-administered medications for this visit.    REVIEW OF SYSTEMS:   Constitutional: ( - ) fevers, ( - )  chills , ( - ) night sweats Eyes: ( - ) blurriness of vision, ( - ) double vision, ( - ) watery eyes Ears, nose, mouth, throat, and face: ( - ) mucositis, ( - ) sore throat Respiratory: ( - ) cough, ( - ) dyspnea, ( - ) wheezes Cardiovascular: ( - ) palpitation, ( - ) chest discomfort, ( - ) lower extremity swelling Gastrointestinal:  ( - ) nausea, ( - ) heartburn, ( - ) change in bowel habits Skin: ( -  ) abnormal skin rashes Lymphatics: ( - ) new lymphadenopathy, ( - ) easy bruising Neurological: ( - ) numbness, ( - ) tingling, ( - ) new weaknesses Behavioral/Psych: ( - ) mood change, ( - ) new changes  All other systems were reviewed with the patient and are negative.  PHYSICAL EXAMINATION: ECOG PERFORMANCE STATUS: 0 - Asymptomatic  Vitals:   08/23/21 1040  BP: 138/88  Pulse: 68  Resp: 17  Temp: 98.3 F (36.8 C)  SpO2: 100%    Filed Weights   08/23/21 1040  Weight: 204 lb 6.4 oz (92.7 kg)     GENERAL: well appearing African American female in NAD  SKIN: skin color, texture, turgor are normal, no rashes or significant lesions EYES: conjunctiva are pink and non-injected, sclera clear OROPHARYNX: no exudate, no erythema; lips, buccal mucosa, and tongue normal  LUNGS: clear to auscultation and percussion with normal breathing effort HEART: regular rate & rhythm and no murmurs and no lower extremity edema ABDOMEN: soft, non-tender, non-distended, normal bowel sounds Musculoskeletal: no cyanosis of digits and no clubbing  PSYCH: alert & oriented x 3, fluent speech NEURO: no focal motor/sensory deficits  LABORATORY DATA:  I have reviewed the data as listed CBC Latest Ref Rng & Units 08/23/2021 05/30/2021 05/23/2021  WBC 4.0 - 10.5 K/uL 9.4 9.5 12.0(H)  Hemoglobin 12.0 - 15.0 g/dL 10.8(L) 11.0(L) 10.9(L)  Hematocrit 36.0 - 46.0 % 34.8(L) 35.7(L) 35.2(L)  Platelets 150 - 400 K/uL 368 384 411(H)    CMP Latest Ref Rng & Units 05/30/2021 05/23/2021 01/23/2018  Glucose 70 - 99 mg/dL 256(L) 81 893(T)  BUN 6 - 20 mg/dL 6 11 15   Creatinine 0.44 - 1.00 mg/dL 3.42 8.76  Sodium 135 - 145 mmol/L 140 141 137  Potassium 3.5 - 5.1 mmol/L 4.0 3.6 3.6  Chloride 98 - 111 mmol/L 109 106 105  CO2 22 - 32 mmol/L 23 24 23   Calcium 8.9 - 10.3 mg/dL 9.2 9.6 8.11)  Total Protein 6.5 - 8.1 g/dL 8.0 8.1 7.1  Total Bilirubin 0.3 - 1.2 mg/dL 0.3 0.7 1.0  Alkaline Phos 38 - 126 U/L 67 70 109  AST  15 - 41 U/L 12(L) 11(L) 19  ALT 0 - 44 U/L 11 9 30     ASSESSMENT & PLAN Latasha Fisher is a 45 y.o. female presenting to the clinic for evaluation for microcytic anemia.  #Microcytic anemia: -Currently on ferrous sulfate 325 mg once daily since labs from 05/23/2021 showed iron saturation of 16%.  -Workup so far showed no evidence of vitamin B12 or folate deficiency. Hgb electrophoresis was negative for hemoglobinopathies. Retic panel showed decreased reticulocyte hemoglobin.  -Repeat labs today show persistent microcytic anemia with Hgb 10.8, MCV 75.8. Iron panel was normal.  -Recommend patient return next week for additional laboratory evaluation  to check other etiologies. This includes serum copper levels, alpha thalassemia genotype testing and save smear.    Orders Placed This Encounter  Procedures   Alpha-Thalassemia GenotypR    Standing Status:   Future    Standing Expiration Date:   08/23/2022   Copper, serum    Standing Status:   Future    Standing Expiration Date:   08/23/2022   Save Smear (SSMR)    Standing Status:   Future    Standing Expiration Date:   08/23/2022   Retic Panel    Standing Status:   Future    Standing Expiration Date:   08/23/2022     All questions were answered. The patient knows to call the clinic with any problems, questions or concerns.  I have spent a total of 25 minutes minutes of face-to-face and non-face-to-face time, preparing to see the patient, performing a medically appropriate examination, counseling and educating the patient, ordering tests, documenting clinical information in the electronic health record, and care coordination.    Latasha Kaufmann, PA-C Department of Hematology/Oncology [facility] at [facility] Phone: [phone]

## 2021-08-24 ENCOUNTER — Telehealth: Payer: Self-pay | Admitting: Physician Assistant

## 2021-08-24 NOTE — Telephone Encounter (Signed)
Sch per 9/29 stf msg/secure chat, pt aware

## 2021-08-29 ENCOUNTER — Other Ambulatory Visit: Payer: Self-pay

## 2021-08-29 ENCOUNTER — Inpatient Hospital Stay: Payer: Federal, State, Local not specified - PPO | Attending: Physician Assistant

## 2021-08-29 DIAGNOSIS — D563 Thalassemia minor: Secondary | ICD-10-CM | POA: Diagnosis not present

## 2021-08-29 DIAGNOSIS — D509 Iron deficiency anemia, unspecified: Secondary | ICD-10-CM | POA: Diagnosis not present

## 2021-08-29 LAB — RETIC PANEL
Immature Retic Fract: 11.9 % (ref 2.3–15.9)
RBC.: 4.42 MIL/uL (ref 3.87–5.11)
Retic Count, Absolute: 49.1 10*3/uL (ref 19.0–186.0)
Retic Ct Pct: 1.1 % (ref 0.4–3.1)
Reticulocyte Hemoglobin: 25.8 pg — ABNORMAL LOW (ref >27.9)

## 2021-08-29 LAB — SAVE SMEAR(SSMR), FOR PROVIDER SLIDE REVIEW

## 2021-08-31 ENCOUNTER — Other Ambulatory Visit: Payer: Self-pay | Admitting: Family Medicine

## 2021-08-31 DIAGNOSIS — Z1231 Encounter for screening mammogram for malignant neoplasm of breast: Secondary | ICD-10-CM

## 2021-08-31 DIAGNOSIS — Z01419 Encounter for gynecological examination (general) (routine) without abnormal findings: Secondary | ICD-10-CM | POA: Diagnosis not present

## 2021-08-31 LAB — COPPER, SERUM: Copper: 213 ug/dL — ABNORMAL HIGH (ref 80–158)

## 2021-09-07 LAB — ALPHA-THALASSEMIA GENOTYPR

## 2021-09-08 ENCOUNTER — Telehealth: Payer: Self-pay | Admitting: Physician Assistant

## 2021-09-08 NOTE — Telephone Encounter (Signed)
I called Ms Latasha Fisher to review the lab results from 08/29/2021. Results confirmed alpha thalassemia minor. Her anemia has been stable for several years so no further intervention is recommended. Discussed that with alpha thalassemia minor, we generally only provide supportive care as needed. Routine follow up in our clinic is not needed.   Patient will contact us if her anemia worsens or has any other questions/concerns.   Patient expressed understanding of the plan provided.

## 2021-10-03 ENCOUNTER — Ambulatory Visit
Admission: RE | Admit: 2021-10-03 | Discharge: 2021-10-03 | Disposition: A | Discharge status: Home / Self Care | Payer: Federal, State, Local not specified - PPO | Source: Ambulatory Visit | Attending: Family Medicine | Admitting: Family Medicine

## 2021-10-03 ENCOUNTER — Other Ambulatory Visit: Payer: Self-pay

## 2021-10-03 DIAGNOSIS — Z1231 Encounter for screening mammogram for malignant neoplasm of breast: Secondary | ICD-10-CM | POA: Diagnosis not present

## 2021-10-06 ENCOUNTER — Other Ambulatory Visit: Payer: Self-pay | Admitting: Family Medicine

## 2021-10-06 DIAGNOSIS — R928 Other abnormal and inconclusive findings on diagnostic imaging of breast: Secondary | ICD-10-CM

## 2021-10-10 DIAGNOSIS — N3000 Acute cystitis without hematuria: Secondary | ICD-10-CM | POA: Diagnosis not present

## 2021-11-07 ENCOUNTER — Other Ambulatory Visit: Payer: Self-pay | Admitting: Family Medicine

## 2021-11-07 ENCOUNTER — Ambulatory Visit
Admission: RE | Admit: 2021-11-07 | Discharge: 2021-11-07 | Disposition: A | Discharge status: Home / Self Care | Payer: Federal, State, Local not specified - PPO | Source: Ambulatory Visit | Attending: Family Medicine | Admitting: Family Medicine

## 2021-11-07 DIAGNOSIS — R928 Other abnormal and inconclusive findings on diagnostic imaging of breast: Secondary | ICD-10-CM

## 2021-11-07 DIAGNOSIS — R922 Inconclusive mammogram: Secondary | ICD-10-CM | POA: Diagnosis not present

## 2021-11-07 DIAGNOSIS — N6489 Other specified disorders of breast: Secondary | ICD-10-CM

## 2021-11-28 ENCOUNTER — Ambulatory Visit
Admission: RE | Admit: 2021-11-28 | Discharge: 2021-11-28 | Disposition: A | Discharge status: Home / Self Care | Payer: Federal, State, Local not specified - PPO | Source: Ambulatory Visit | Attending: Family Medicine | Admitting: Family Medicine

## 2021-11-28 DIAGNOSIS — N6489 Other specified disorders of breast: Secondary | ICD-10-CM

## 2021-11-28 DIAGNOSIS — N6322 Unspecified lump in the left breast, upper inner quadrant: Secondary | ICD-10-CM | POA: Diagnosis not present

## 2021-11-28 DIAGNOSIS — R928 Other abnormal and inconclusive findings on diagnostic imaging of breast: Secondary | ICD-10-CM | POA: Diagnosis not present

## 2021-11-28 HISTORY — PX: BREAST BIOPSY: SHX20

## 2021-12-07 ENCOUNTER — Other Ambulatory Visit: Payer: Self-pay | Admitting: Family Medicine

## 2021-12-07 DIAGNOSIS — Z1231 Encounter for screening mammogram for malignant neoplasm of breast: Secondary | ICD-10-CM

## 2022-02-13 DIAGNOSIS — I10 Essential (primary) hypertension: Secondary | ICD-10-CM | POA: Diagnosis not present

## 2022-02-13 DIAGNOSIS — D508 Other iron deficiency anemias: Secondary | ICD-10-CM | POA: Diagnosis not present

## 2022-02-13 DIAGNOSIS — R7303 Prediabetes: Secondary | ICD-10-CM | POA: Diagnosis not present

## 2022-02-13 DIAGNOSIS — E669 Obesity, unspecified: Secondary | ICD-10-CM | POA: Diagnosis not present

## 2022-03-15 IMAGING — MG MM BREAST BX W LOC DEV 1ST LESION IMAGE BX SPEC STEREO GUIDE*L*
8 of 12 series · 8 of 28 positions shown · non-contrast
Comparison: Previous exams.
COMPARISON: Previous exams.

Addendum:
CLINICAL DATA: Patient presents for stereotactic core needle biopsy
of a mass in the upper left breast.

EXAM:
LEFT BREAST STEREOTACTIC CORE NEEDLE BIOPSY

[L (1 of 8)]
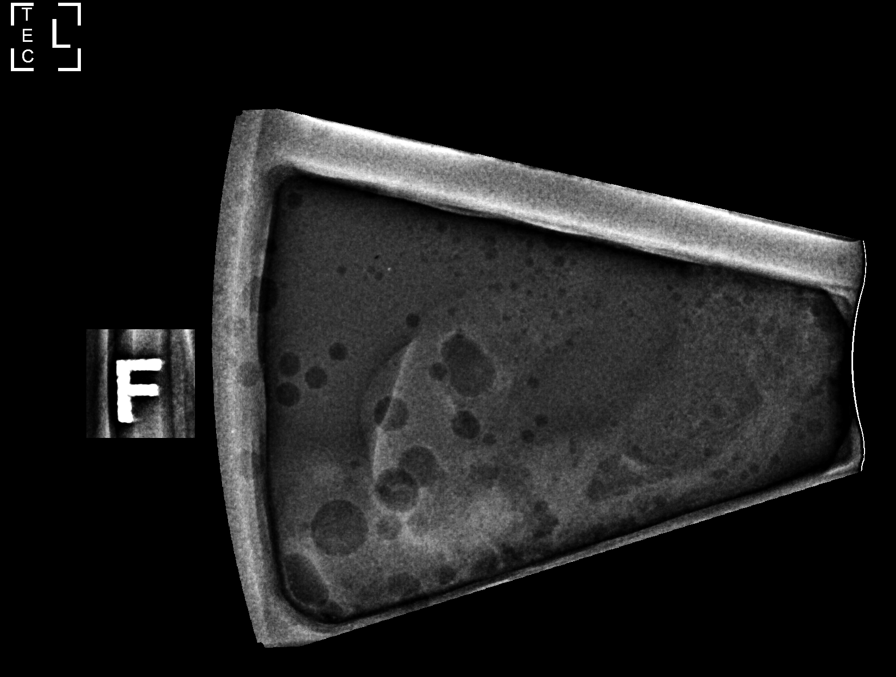

[L (2 of 8)]
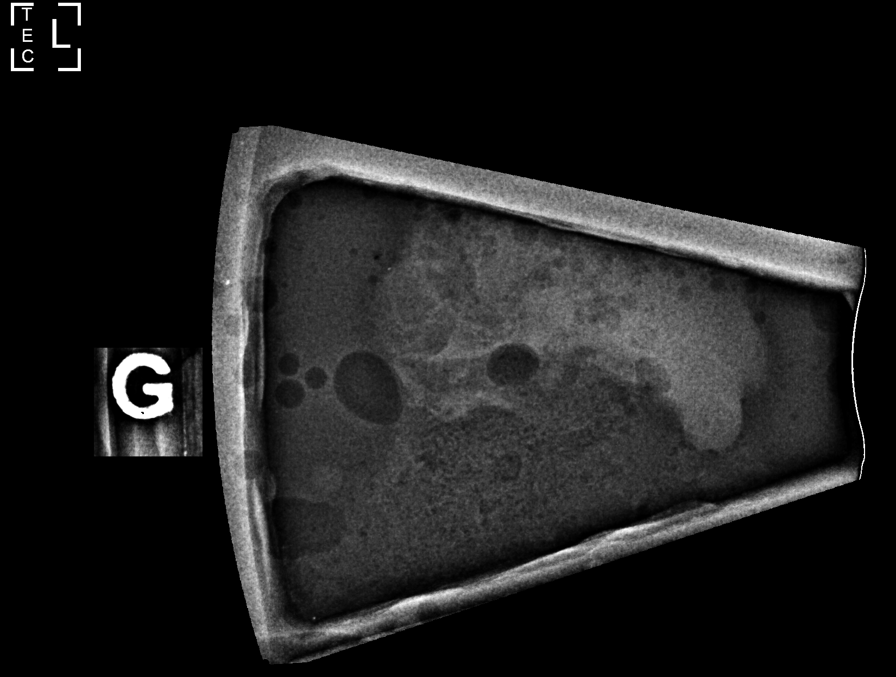

[L (3 of 8)]
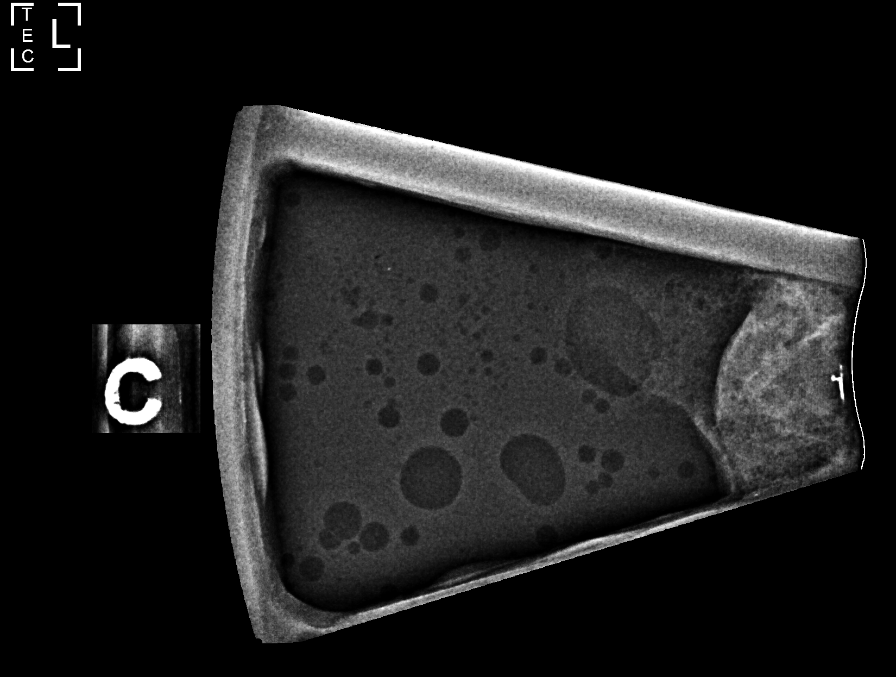

[L (4 of 8)]
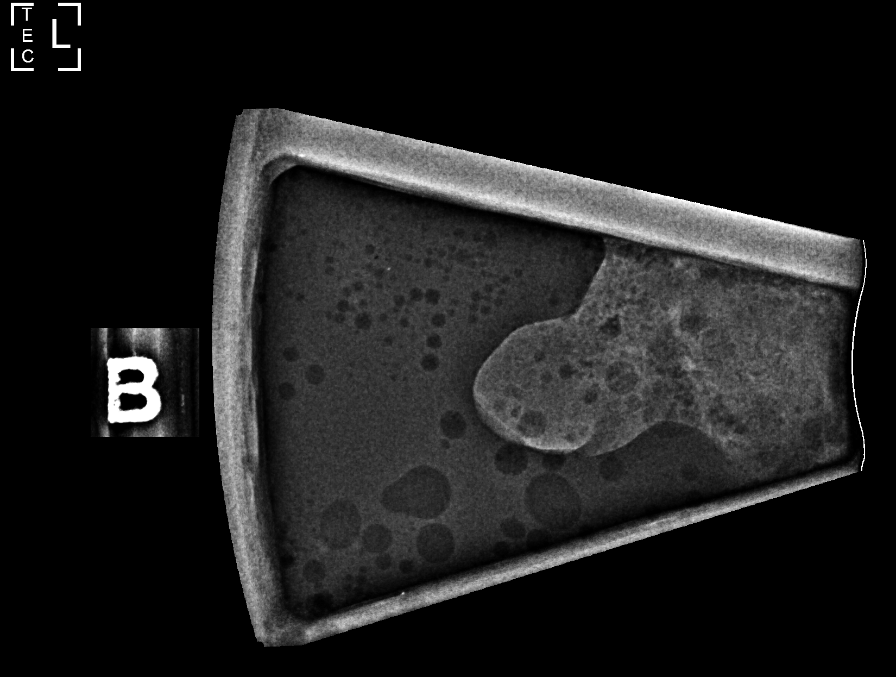

[L (5 of 8)]
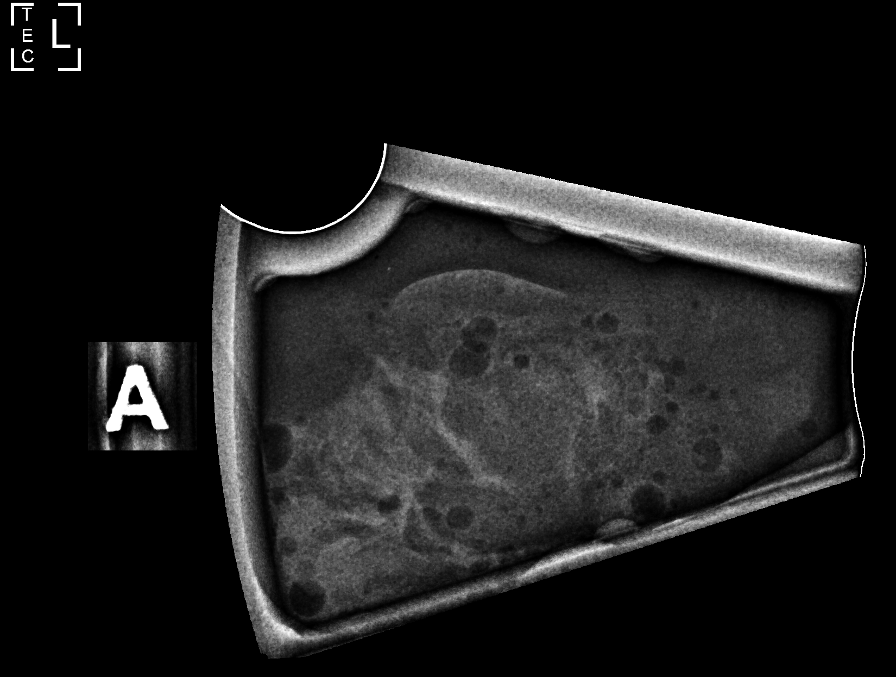

[L (6 of 8)]
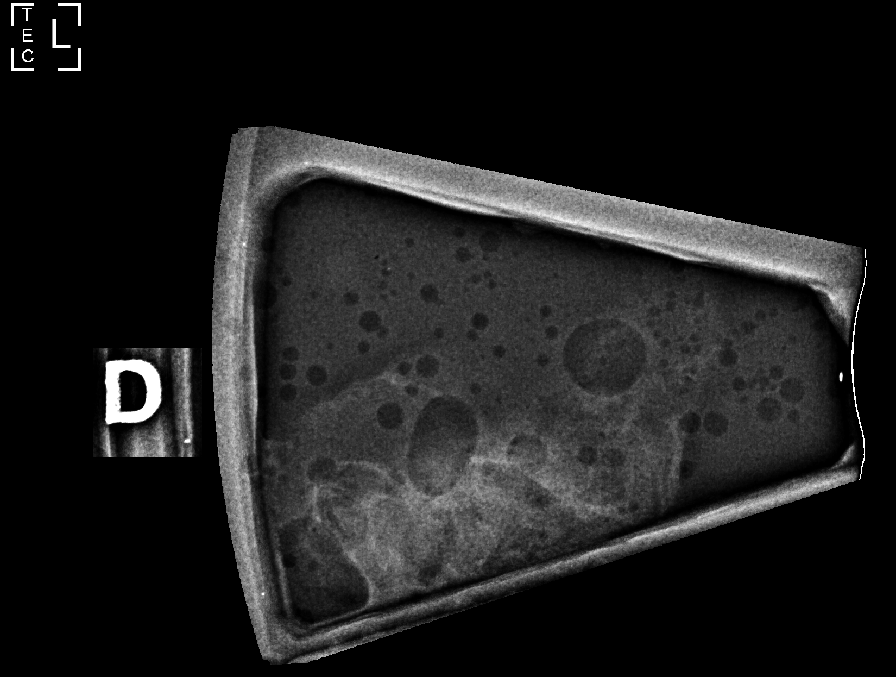

[L (7 of 8)]
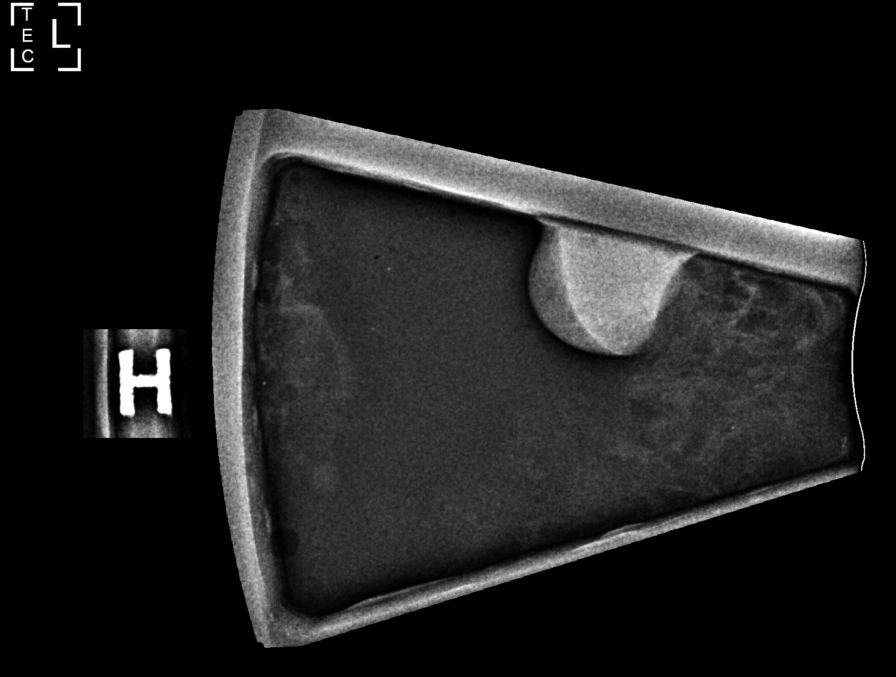

[L (8 of 8)]
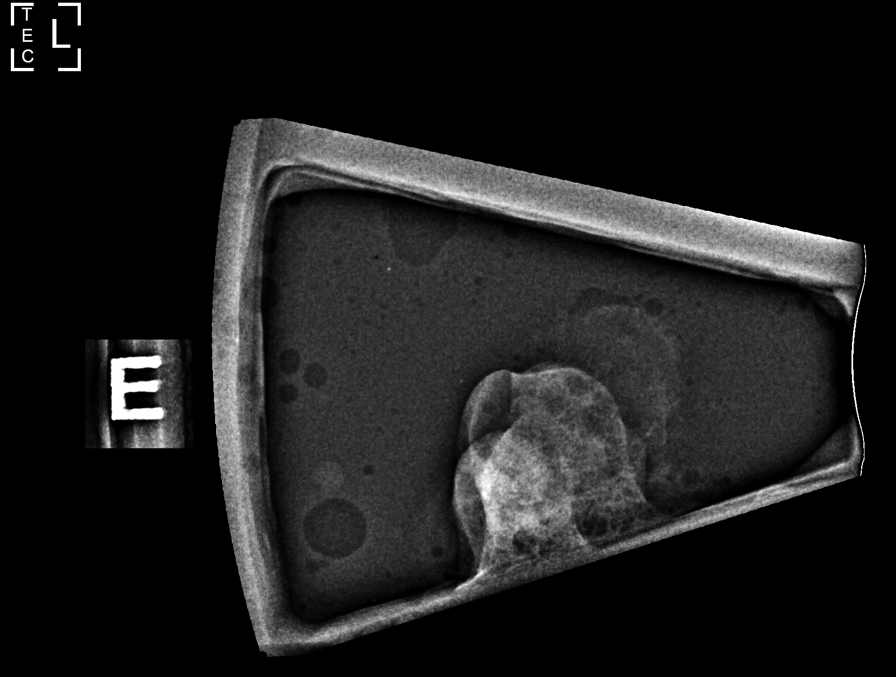

[8 of 28 positions shown; findings below may reference images not displayed]



Using sterile technique and 1% Lidocaine as local anesthetic, under
stereotactic guidance, a 9 gauge vacuum assisted device was used to
perform core needle biopsy of the subtle masslike opacity in the
upper left breast using a MLO approach.

Lesion quadrant: Upper inner quadrant

At the conclusion of the procedure, a ribbon shaped tissue marker
clip was deployed into the biopsy cavity. Follow-up 2-view mammogram
was performed and dictated separately.
IMPRESSION: Stereotactic-guided biopsy of a subtle breast mass. No apparent
complications.

ADDENDUM:
Pathology revealed BENIGN SMOOTH MUSCLE PROLIFERATION of the LEFT
breast, upper inner quadrant, (ribbon clip). This was found to be
concordant by Dr. Ismelda Eddings.

Pathology results were discussed with the patient by telephone. The
patient reported doing well after the biopsy with tenderness at the
site. Post biopsy instructions and care were reviewed and questions
were answered. The patient was encouraged to call The [REDACTED]

The patient was instructed to return for annual screening
mammography in September 2022.

Pathology results reported by Quirijn Amazigh, RN on 12/01/2021.



Using sterile technique and 1% Lidocaine as local anesthetic, under
stereotactic guidance, a 9 gauge vacuum assisted device was used to
perform core needle biopsy of the subtle masslike opacity in the
upper left breast using a MLO approach.

Lesion quadrant: Upper inner quadrant

At the conclusion of the procedure, a ribbon shaped tissue marker
clip was deployed into the biopsy cavity. Follow-up 2-view mammogram
was performed and dictated separately.
IMPRESSION: Stereotactic-guided biopsy of a subtle breast mass. No apparent
complications.

## 2022-08-15 DIAGNOSIS — K648 Other hemorrhoids: Secondary | ICD-10-CM | POA: Diagnosis not present

## 2022-08-15 DIAGNOSIS — Z1211 Encounter for screening for malignant neoplasm of colon: Secondary | ICD-10-CM | POA: Diagnosis not present

## 2022-09-04 DIAGNOSIS — Z01419 Encounter for gynecological examination (general) (routine) without abnormal findings: Secondary | ICD-10-CM | POA: Diagnosis not present

## 2022-09-04 DIAGNOSIS — Z124 Encounter for screening for malignant neoplasm of cervix: Secondary | ICD-10-CM | POA: Diagnosis not present

## 2022-09-14 ENCOUNTER — Telehealth: Payer: Self-pay

## 2022-09-14 NOTE — Patient Outreach (Signed)
  Care Coordination   09/14/2022 Name: Latasha Fisher MRN: [id number] DOB: [birth year]   Care Coordination Outreach Attempts:  An unsuccessful telephone outreach was attempted today to offer the patient information about available care coordination services as a benefit of their health plan.   Follow Up Plan:  Additional outreach attempts will be made to offer the patient care coordination information and services.   Encounter Outcome:  No Answer  Care Coordination Interventions Activated:  No   Care Coordination Interventions:  No, not indicated    Enzo Montgomery, RN,BSN,CCM [facility] Telephonic Care Management Coordinator Direct Phone: [phone] Toll Free: [phone] Fax: [phone]

## 2022-09-19 ENCOUNTER — Telehealth: Payer: Self-pay

## 2022-09-19 NOTE — Patient Outreach (Signed)
  Care Coordination   Initial Visit Note   09/19/2022 Name: KAIZLEE CARLINO MRN: [id number] DOB: 1976/05/20  Charolette ADINA PUZZO is a 46 y.o. year old female who sees Kathyrn Lass, MD for primary care. I spoke with  Vivien Rota by phone today.  What matters to the patients health and wellness today?  Patient denies any current issues or concerns at present.     Goals Addressed             This Visit's Progress    COMPLETED: Care Coordination-no follow up required       Care Coordination Interventions: Provided education to patient re: [facility] services Assessed social determinant of health barriers Flu vaccine obtained 08/30/22. Patient voices she had physical in March.           SDOH assessments and interventions completed:  Yes  SDOH Interventions Today    Flowsheet Row Most Recent Value  SDOH Interventions   Food Insecurity Interventions Intervention Not Indicated  Transportation Interventions Intervention Not Indicated        Care Coordination Interventions Activated:  Yes  Care Coordination Interventions:  Yes, provided   Follow up plan: No further intervention required.   Encounter Outcome:  Pt. Visit Completed   Enzo Montgomery, RN,BSN,CCM [facility] [employer] Telephonic Care Management Coordinator Direct Phone: [phone] Toll Free: [phone] Fax: [phone]

## 2022-10-09 ENCOUNTER — Ambulatory Visit
Admission: RE | Admit: 2022-10-09 | Discharge: 2022-10-09 | Disposition: A | Discharge status: Home / Self Care | Payer: Federal, State, Local not specified - PPO | Source: Ambulatory Visit | Attending: Family Medicine | Admitting: Family Medicine

## 2022-10-09 DIAGNOSIS — Z1231 Encounter for screening mammogram for malignant neoplasm of breast: Secondary | ICD-10-CM | POA: Diagnosis not present

## 2022-10-22 ENCOUNTER — Other Ambulatory Visit: Payer: Self-pay | Admitting: Family Medicine

## 2022-10-22 DIAGNOSIS — Z1231 Encounter for screening mammogram for malignant neoplasm of breast: Secondary | ICD-10-CM

## 2023-04-03 DIAGNOSIS — I10 Essential (primary) hypertension: Secondary | ICD-10-CM | POA: Diagnosis not present

## 2023-04-03 DIAGNOSIS — J069 Acute upper respiratory infection, unspecified: Secondary | ICD-10-CM | POA: Diagnosis not present

## 2023-04-03 DIAGNOSIS — Z6831 Body mass index (BMI) 31.0-31.9, adult: Secondary | ICD-10-CM | POA: Diagnosis not present

## 2023-04-03 DIAGNOSIS — D563 Thalassemia minor: Secondary | ICD-10-CM | POA: Diagnosis not present

## 2023-04-03 DIAGNOSIS — R7303 Prediabetes: Secondary | ICD-10-CM | POA: Diagnosis not present

## 2023-04-16 DIAGNOSIS — D72829 Elevated white blood cell count, unspecified: Secondary | ICD-10-CM | POA: Diagnosis not present

## 2023-09-12 DIAGNOSIS — Z01419 Encounter for gynecological examination (general) (routine) without abnormal findings: Secondary | ICD-10-CM | POA: Diagnosis not present

## 2023-10-14 ENCOUNTER — Ambulatory Visit
Admission: RE | Admit: 2023-10-14 | Discharge: 2023-10-14 | Disposition: A | Discharge status: Home / Self Care | Payer: Federal, State, Local not specified - PPO | Source: Ambulatory Visit | Attending: Family Medicine | Admitting: Family Medicine

## 2023-10-14 ENCOUNTER — Ambulatory Visit: Payer: Federal, State, Local not specified - PPO

## 2023-10-14 DIAGNOSIS — Z1231 Encounter for screening mammogram for malignant neoplasm of breast: Secondary | ICD-10-CM

## 2023-11-26 ENCOUNTER — Other Ambulatory Visit: Payer: Self-pay | Admitting: Family Medicine

## 2023-11-26 DIAGNOSIS — Z1231 Encounter for screening mammogram for malignant neoplasm of breast: Secondary | ICD-10-CM

## 2024-04-07 DIAGNOSIS — Z6833 Body mass index (BMI) 33.0-33.9, adult: Secondary | ICD-10-CM | POA: Diagnosis not present

## 2024-04-07 DIAGNOSIS — E669 Obesity, unspecified: Secondary | ICD-10-CM | POA: Diagnosis not present

## 2024-04-07 DIAGNOSIS — D563 Thalassemia minor: Secondary | ICD-10-CM | POA: Diagnosis not present

## 2024-04-07 DIAGNOSIS — I10 Essential (primary) hypertension: Secondary | ICD-10-CM | POA: Diagnosis not present

## 2024-04-07 DIAGNOSIS — R7303 Prediabetes: Secondary | ICD-10-CM | POA: Diagnosis not present

## 2024-10-06 DIAGNOSIS — Z01419 Encounter for gynecological examination (general) (routine) without abnormal findings: Secondary | ICD-10-CM | POA: Diagnosis not present

## 2024-10-14 ENCOUNTER — Ambulatory Visit
Admission: RE | Admit: 2024-10-14 | Discharge: 2024-10-14 | Disposition: A | Discharge status: Home / Self Care | Payer: Federal, State, Local not specified - PPO | Source: Ambulatory Visit | Attending: Family Medicine | Admitting: Family Medicine

## 2024-10-14 DIAGNOSIS — Z1231 Encounter for screening mammogram for malignant neoplasm of breast: Secondary | ICD-10-CM | POA: Diagnosis not present
